# Patient Record
Sex: Male | Born: 1990 | Race: Black or African American | Hispanic: No | Marital: Married | Smoking: Never smoker
Health system: Southern US, Community
[De-identification: ages and names within clinical notes are randomized; demographics above are authoritative.]

## PROBLEM LIST (undated history)

## (undated) DIAGNOSIS — F8189 Other developmental disorders of scholastic skills: Secondary | ICD-10-CM

## (undated) DIAGNOSIS — J45909 Unspecified asthma, uncomplicated: Secondary | ICD-10-CM

## (undated) DIAGNOSIS — J309 Allergic rhinitis, unspecified: Secondary | ICD-10-CM

## (undated) DIAGNOSIS — L2089 Other atopic dermatitis: Secondary | ICD-10-CM

## (undated) HISTORY — DX: Other developmental disorders of scholastic skills: F81.89

## (undated) HISTORY — DX: Allergic rhinitis, unspecified: J30.9

## (undated) HISTORY — DX: Unspecified asthma, uncomplicated: J45.909

## (undated) HISTORY — DX: Other atopic dermatitis: L20.89

---
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
## 2009-03-26 ENCOUNTER — Ambulatory Visit: Payer: Self-pay | Admitting: Internal Medicine

## 2009-03-26 LAB — CONVERTED CEMR LAB
ALT: 41 units/L (ref 0–53)
AST: 34 units/L (ref 0–37)
Albumin: 4.4 g/dL (ref 3.5–5.2)
Alkaline Phosphatase: 100 units/L (ref 39–117)
Amphetamine Screen, Ur: NEGATIVE
BUN: 9 mg/dL (ref 6–23)
Barbiturate Quant, Ur: NEGATIVE
Basophils Absolute: 0.1 10*3/uL (ref 0.0–0.1)
Basophils Relative: 0.7 % (ref 0.0–3.0)
Benzodiazepines.: NEGATIVE
Bilirubin Urine: NEGATIVE
Bilirubin, Direct: 0.1 mg/dL (ref 0.0–0.3)
CO2: 32 meq/L (ref 19–32)
Calcium: 9.7 mg/dL (ref 8.4–10.5)
Chloride: 100 meq/L (ref 96–112)
Cocaine Metabolites: NEGATIVE
Creatinine, Ser: 0.7 mg/dL (ref 0.4–1.5)
Creatinine,U: 103.7 mg/dL
Eosinophils Absolute: 2 10*3/uL — ABNORMAL HIGH (ref 0.0–0.7)
Eosinophils Relative: 26 % — ABNORMAL HIGH (ref 0.0–5.0)
GFR calc non Af Amer: 185.25 mL/min (ref >60)
Glucose, Bld: 91 mg/dL (ref 70–99)
HCT: 45.7 % (ref 39.0–52.0)
Hemoglobin, Urine: NEGATIVE
Hemoglobin: 15.2 g/dL (ref 13.0–17.0)
Ketones, ur: NEGATIVE mg/dL
Leukocytes, UA: NEGATIVE
Lymphocytes Relative: 14.2 % (ref 12.0–46.0)
Lymphs Abs: 1.1 10*3/uL (ref 0.7–4.0)
MCHC: 33.2 g/dL (ref 30.0–36.0)
MCV: 97.5 fL (ref 78.0–100.0)
Marijuana Metabolite: NEGATIVE
Methadone: NEGATIVE
Monocytes Absolute: 0.3 10*3/uL (ref 0.1–1.0)
Monocytes Relative: 3.7 % (ref 3.0–12.0)
Neutro Abs: 4.3 10*3/uL (ref 1.4–7.7)
Neutrophils Relative %: 55.4 % (ref 43.0–77.0)
Nitrite: NEGATIVE
Opiates: NEGATIVE
Phencyclidine (PCP): NEGATIVE
Platelets: 290 10*3/uL (ref 150.0–400.0)
Potassium: 3.5 meq/L (ref 3.5–5.1)
Propoxyphene: NEGATIVE
RBC: 4.69 M/uL (ref 4.22–5.81)
RDW: 13.4 % (ref 11.5–14.6)
Sodium: 140 meq/L (ref 135–145)
Specific Gravity, Urine: 1.015 (ref 1.000–1.030)
TSH: 1 microintl units/mL (ref 0.35–5.50)
Total Bilirubin: 0.9 mg/dL (ref 0.3–1.2)
Total Protein, Urine: 30 mg/dL
Total Protein: 7.5 g/dL (ref 6.0–8.3)
Urine Glucose: NEGATIVE mg/dL
Urobilinogen, UA: 1 (ref 0.0–1.0)
WBC: 7.8 10*3/uL (ref 4.5–10.5)
pH: 6.5 (ref 5.0–8.0)

## 2009-03-27 DIAGNOSIS — J45909 Unspecified asthma, uncomplicated: Secondary | ICD-10-CM | POA: Insufficient documentation

## 2009-03-27 HISTORY — DX: Unspecified asthma, uncomplicated: J45.909

## 2009-03-30 ENCOUNTER — Telehealth: Payer: Self-pay | Admitting: Internal Medicine

## 2009-04-09 ENCOUNTER — Ambulatory Visit: Payer: Self-pay | Admitting: Internal Medicine

## 2009-05-04 ENCOUNTER — Emergency Department (HOSPITAL_COMMUNITY): Admission: EM | Admit: 2009-05-04 | Discharge: 2009-05-04 | Payer: Self-pay | Admitting: Emergency Medicine

## 2009-05-06 ENCOUNTER — Ambulatory Visit: Payer: Self-pay | Admitting: Internal Medicine

## 2009-05-11 ENCOUNTER — Telehealth: Payer: Self-pay | Admitting: Internal Medicine

## 2009-06-26 ENCOUNTER — Telehealth: Payer: Self-pay | Admitting: Internal Medicine

## 2009-06-26 ENCOUNTER — Ambulatory Visit: Payer: Self-pay | Admitting: Internal Medicine

## 2009-06-26 DIAGNOSIS — L2089 Other atopic dermatitis: Secondary | ICD-10-CM | POA: Insufficient documentation

## 2009-06-26 HISTORY — DX: Other atopic dermatitis: L20.89

## 2009-06-26 LAB — CONVERTED CEMR LAB
Basophils Absolute: 0 10*3/uL (ref 0.0–0.1)
Basophils Relative: 0 % (ref 0.0–3.0)
Eosinophils Absolute: 1.6 10*3/uL — ABNORMAL HIGH (ref 0.0–0.7)
Eosinophils Relative: 10.8 % — ABNORMAL HIGH (ref 0.0–5.0)
HCT: 43.3 % (ref 39.0–52.0)
Hemoglobin: 14.1 g/dL (ref 13.0–17.0)
IgE (Immunoglobulin E), Serum: 1548.6 intl units/mL — ABNORMAL HIGH (ref 0.0–180.0)
Lymphocytes Relative: 13.4 % (ref 12.0–46.0)
Lymphs Abs: 1.9 10*3/uL (ref 0.7–4.0)
MCHC: 32.7 g/dL (ref 30.0–36.0)
MCV: 97.7 fL (ref 78.0–100.0)
Monocytes Absolute: 1.4 10*3/uL — ABNORMAL HIGH (ref 0.1–1.0)
Monocytes Relative: 9.5 % (ref 3.0–12.0)
Neutro Abs: 9.5 10*3/uL — ABNORMAL HIGH (ref 1.4–7.7)
Neutrophils Relative %: 66.3 % (ref 43.0–77.0)
Platelets: 369 10*3/uL (ref 150.0–400.0)
RBC: 4.43 M/uL (ref 4.22–5.81)
RDW: 13.3 % (ref 11.5–14.6)
WBC: 14.4 10*3/uL — ABNORMAL HIGH (ref 4.5–10.5)

## 2009-09-30 ENCOUNTER — Ambulatory Visit: Payer: Self-pay | Admitting: Internal Medicine

## 2009-09-30 DIAGNOSIS — J309 Allergic rhinitis, unspecified: Secondary | ICD-10-CM

## 2009-09-30 HISTORY — DX: Allergic rhinitis, unspecified: J30.9

## 2009-10-01 ENCOUNTER — Encounter (INDEPENDENT_AMBULATORY_CARE_PROVIDER_SITE_OTHER): Payer: Self-pay | Admitting: *Deleted

## 2009-10-06 ENCOUNTER — Telehealth: Payer: Self-pay | Admitting: Internal Medicine

## 2009-10-14 ENCOUNTER — Encounter: Payer: Self-pay | Admitting: Internal Medicine

## 2009-10-20 ENCOUNTER — Ambulatory Visit: Payer: Self-pay | Admitting: Internal Medicine

## 2009-10-20 DIAGNOSIS — R5381 Other malaise: Secondary | ICD-10-CM

## 2009-10-20 DIAGNOSIS — R5383 Other fatigue: Secondary | ICD-10-CM

## 2009-10-20 LAB — CONVERTED CEMR LAB
ALT: 21 units/L (ref 0–53)
AST: 21 units/L (ref 0–37)
Albumin: 4.5 g/dL (ref 3.5–5.2)
Alkaline Phosphatase: 101 units/L (ref 39–117)
BUN: 7 mg/dL (ref 6–23)
Basophils Absolute: 0 10*3/uL (ref 0.0–0.1)
Basophils Relative: 0 % (ref 0.0–3.0)
Bilirubin, Direct: 0.1 mg/dL (ref 0.0–0.3)
CO2: 29 meq/L (ref 19–32)
Calcium: 10.3 mg/dL (ref 8.4–10.5)
Chloride: 100 meq/L (ref 96–112)
Creatinine, Ser: 0.7 mg/dL (ref 0.4–1.5)
Eosinophils Absolute: 0 10*3/uL (ref 0.0–0.7)
Eosinophils Relative: 0 % (ref 0.0–5.0)
GFR calc non Af Amer: 200.63 mL/min (ref >60)
Glucose, Bld: 113 mg/dL — ABNORMAL HIGH (ref 70–99)
HCT: 48.8 % (ref 39.0–52.0)
Hemoglobin: 16.4 g/dL (ref 13.0–17.0)
Lymphocytes Relative: 9.8 % — ABNORMAL LOW (ref 12.0–46.0)
Lymphs Abs: 1.4 10*3/uL (ref 0.7–4.0)
MCHC: 33.7 g/dL (ref 30.0–36.0)
MCV: 96.4 fL (ref 78.0–100.0)
Monocytes Absolute: 0.8 10*3/uL (ref 0.1–1.0)
Monocytes Relative: 6 % (ref 3.0–12.0)
Neutro Abs: 11.7 10*3/uL — ABNORMAL HIGH (ref 1.4–7.7)
Neutrophils Relative %: 84.2 % — ABNORMAL HIGH (ref 43.0–77.0)
Platelets: 333 10*3/uL (ref 150.0–400.0)
Potassium: 3.5 meq/L (ref 3.5–5.1)
RBC: 5.06 M/uL (ref 4.22–5.81)
RDW: 14.2 % (ref 11.5–14.6)
Sed Rate: 9 mm/hr (ref 0–22)
Sodium: 143 meq/L (ref 135–145)
TSH: 0.62 microintl units/mL (ref 0.35–5.50)
Total Bilirubin: 0.4 mg/dL (ref 0.3–1.2)
Total Protein: 8.1 g/dL (ref 6.0–8.3)
WBC: 13.9 10*3/uL — ABNORMAL HIGH (ref 4.5–10.5)

## 2009-10-21 ENCOUNTER — Encounter (INDEPENDENT_AMBULATORY_CARE_PROVIDER_SITE_OTHER): Payer: Self-pay | Admitting: *Deleted

## 2009-11-03 ENCOUNTER — Ambulatory Visit: Payer: Self-pay | Admitting: Internal Medicine

## 2009-11-23 ENCOUNTER — Telehealth: Payer: Self-pay | Admitting: Internal Medicine

## 2009-11-23 ENCOUNTER — Ambulatory Visit: Payer: Self-pay | Admitting: Internal Medicine

## 2009-12-23 ENCOUNTER — Telehealth: Payer: Self-pay | Admitting: Internal Medicine

## 2009-12-30 ENCOUNTER — Encounter: Payer: Self-pay | Admitting: Internal Medicine

## 2010-03-01 ENCOUNTER — Ambulatory Visit: Payer: Self-pay | Admitting: Internal Medicine

## 2010-03-01 ENCOUNTER — Telehealth: Payer: Self-pay | Admitting: Internal Medicine

## 2010-03-01 DIAGNOSIS — J019 Acute sinusitis, unspecified: Secondary | ICD-10-CM

## 2010-03-15 ENCOUNTER — Ambulatory Visit: Payer: Self-pay | Admitting: Internal Medicine

## 2010-05-11 ENCOUNTER — Ambulatory Visit: Payer: Self-pay | Admitting: Internal Medicine

## 2010-05-11 ENCOUNTER — Telehealth: Payer: Self-pay | Admitting: Internal Medicine

## 2010-05-11 DIAGNOSIS — F8189 Other developmental disorders of scholastic skills: Secondary | ICD-10-CM

## 2010-05-11 DIAGNOSIS — T783XXA Angioneurotic edema, initial encounter: Secondary | ICD-10-CM | POA: Insufficient documentation

## 2010-05-11 DIAGNOSIS — L509 Urticaria, unspecified: Secondary | ICD-10-CM

## 2010-05-11 HISTORY — DX: Other developmental disorders of scholastic skills: F81.89

## 2010-05-14 ENCOUNTER — Telehealth: Payer: Self-pay | Admitting: Internal Medicine

## 2010-05-14 ENCOUNTER — Encounter: Payer: Self-pay | Admitting: Internal Medicine

## 2010-06-18 ENCOUNTER — Ambulatory Visit (HOSPITAL_BASED_OUTPATIENT_CLINIC_OR_DEPARTMENT_OTHER)
Admission: RE | Admit: 2010-06-18 | Discharge: 2010-06-18 | Payer: Self-pay | Source: Home / Self Care | Attending: Internal Medicine | Admitting: Internal Medicine

## 2010-06-21 ENCOUNTER — Encounter: Payer: Self-pay | Admitting: Internal Medicine

## 2010-07-04 ENCOUNTER — Encounter: Payer: Self-pay | Admitting: Internal Medicine

## 2010-07-13 NOTE — Progress Notes (Signed)
Summary: ALLERGY TESTING  Phone Note Call from Patient   Caller: kathleen- [facility] Call For: katie Summary of Call: pt is scheduled (per your request) for ALT ON 7 / 12 AT 3PM.  Initial call taken by: Tivis Ringer, CNA,  November 23, 2009 5:32 PM  Follow-up for Phone Call        Noted and placed on calender.[name] CMA  November 24, 2009 8:21 AM

## 2010-07-13 NOTE — Progress Notes (Signed)
Summary: PA Topicort  Phone Note From Pharmacy   Caller: [facility]  [address] [phone]* Summary of Call: PA required for Topicort 0.25% cream. PA approved from 05/14/2010 - 05-14-2011. Pharmacy informed Initial call taken by: Margaret Pyle, CMA,  May 14, 2010 3:20 PM

## 2010-07-13 NOTE — Letter (Signed)
Summary: [facility] Consult Scheduled Letter  [facility]  [address] [place], [place] [address]   Phone: [phone]  Fax: [phone]      10/21/2009 MRN: [id number]  [facility] [address] [place], [place]  [address]    Dear Roger Henderson,      We have scheduled an appointment for you.  At the recommendation of Dr.Jones, we have scheduled you a consult with Dr.Young [facility] on June 13,2011 at 3:45pm.Their phone number is [phone].  If this appointment day and time is not convenient for you, please feel free to call the office of the doctor you are being referred to at the number listed above and reschedule the appointment.  [facility] - 2Nd Floor  [address]  [place] [address]  Thank you,  Patient Care Coordinator [facility]

## 2010-07-13 NOTE — Progress Notes (Signed)
Summary: Call Report  Phone Note Other Incoming   Caller: [facility] Summary of Call: [facility] Triage Call Report Triage Record Num: [id number] Operator: Chevis Pretty Patient Name: Roger Henderson Call Date & Time: 05/07/2010 1:12:40PM Patient Phone: [phone] PCP: Patient Gender: Male PCP Fax : Patient DOB: 1991/05/02 Practice Name: [facility] Reason for Call: Dad/William calling about cough and congestion. States cold symptoms and congestion x 2 weeks. Patient has asthma but currently is not wheezing. c/o headache with congestion. Denies earache, wheezing, resp distress, or fever. Coughing up yellow/green mucus. Per protocol, appt advised within 24 hours; no answer at back door line as published. Patient is near the office; states will check to see if office still open; if not, will take to UC. Protocol(s) Used: Cough (Pediatric) Recommended Outcome per Protocol: See Provider within 24 hours Reason for Outcome: [1] Age > 5 years AND [2] sinus pain (not just congestion) is also present Care Advice:  ~ CARE ADVICE given per Cough (Pediatric) guideline.  ~ AVOID TOBACCO SMOKE: Active or passive smoking makes coughs much worse. CALL BACK IF: - Your child becomes worse  ~ COUGHING SPASMS: - Expose to warm mist (e.g., foggy bathroom). - Give warm fluids to drink (e.g., warm water or apple juice). - Amount: If 3 months to 22 year of age, give warm fluids in a dosage of 1-3 teaspoons (5-15 ml) four times per day when coughing. If over 1 year of age, use unlimited amounts as needed. - Reason: both relax the airway and loosen up the phlegm  ~ DECONGESTANT NOSE SPRAY (No prescription needed): - Use this only if the sinus still seems blocked up after nasal washes AND age 23 years or older. Use the long-acting type (e.g., Afrin). - Dose: 1 spray on each side 2 times/day. - Always clean out the nose before using. - Use routinely for 2 days, thereafter only for symptoms.  Don't use for more than 5 days. (Reason: rebound congestion.)  ~ FLUIDS: Encourage your child to drink adequate fluids to prevent dehydration.  Initial call taken by: Margaret Pyle, CMA,  May 11, 2010 8:45 AM

## 2010-07-13 NOTE — Assessment & Plan Note (Signed)
Summary: congestion/#/cd   Vital Signs:  Patient profile:   20 year old male Height:      71 inches Weight:      136.25 pounds BMI:     19.07 O2 Sat:      98 % on Room air Temp:     97.9 degrees F oral Pulse rate:   69 / minute BP sitting:   100 / 62  (left arm) Cuff size:   regular  Vitals Entered By: Zella Ball Ewing CMA Duncan Dull) (May 11, 2010 2:58 PM)  O2 Flow:  Room air CC: Chest congestion and cough/RE   Primary Care Provider:  Etta Grandchild MD  CC:  Chest congestion and cough/RE.  History of Present Illness: Here with family friend;  c/o 2 wks gradually worsening rash, usually to the torso and extremities that seems to come and go with itching (no pain or fever, or trauma) and mostly gone today, but here as his face and upper chest now with similar rash but also with marked swelling diffusely with itching as well; Pt denies CP, worsening sob, doe, wheezing, orthopnea, pnd, worsening LE edema, palps, dizziness or syncope  Pt denies new neuro symptoms such as headache, facial or extremity weakness  Has some nasal congestion with clearish drainage and cough,  as well as requestin refill on chronic meds for which he is out;  again no fever, pain, chills, n/v, wheezing, lip or tongue swelling.  No other new or ongoing prescriptoin or OTC med use.  Uses Dove soap at home, not sure about which detergents or any change recently.    Problems Prior to Update: 1)  Learning Disability  (ICD-315.2) 2)  Angioedema  (ICD-995.1) 3)  Urticaria  (ICD-708.9) 4)  Sinusitis- Acute-nos  (ICD-461.9) 5)  Allergic Rhinitis  (ICD-477.9) 6)  ? of Obstructive Sleep Apnea  (ICD-327.23) 7)  Fatigue  (ICD-780.79) 8)  Allergic Rhinitis Cause Unspecified  (ICD-477.9) 9)  Eczema, Atopic  (ICD-691.8) 10)  Asthma  (ICD-493.90)  Medications Prior to Update: 1)  Topicort 0.25 % Crea (Desoximetasone) .... Apply To Rash Two Times A Day, Ok To Fill Today 2)  Loratadine 10 Mg Tabs (Loratadine) .Marland Kitchen.. 1 By Mouth  Qd  Current Medications (verified): 1)  Topicort 0.25 % Crea (Desoximetasone) .... Apply To Rash Two Times A Day, Ok To Fill Today 2)  Loratadine 10 Mg Tabs (Loratadine) .Marland Kitchen.. 1 By Mouth Once Daily 3)  Prednisone 10 Mg Tabs (Prednisone) .... 4po Qd For 3days, Then 3po Qd For 3days, Then 2po Qd For 3days, Then 1po Qd For 3 Days, Then Stop 4)  Hydroxyzine Hcl 25 Mg Tabs (Hydroxyzine Hcl) .Marland Kitchen.. 1po Q 6 Hrs As Needed Itch  Allergies (verified): No Known Drug Allergies  Past History:  Past Surgical History: Last updated: 03/26/2009 Denies surgical history  Social History: Last updated: 03/26/2009 11 th grade Never Smoked Alcohol use-no Drug use-no Regular exercise-yes  Risk Factors: Alcohol Use: 0 (03/15/2010) Exercise: yes (03/26/2009)  Risk Factors: Smoking Status: never (03/15/2010) Passive Smoke Exposure: yes (03/15/2010)  Past Medical History: eczema Asthma Allergic Rhinitis learning disabled  Review of Systems       all otherwise negative per pt -    Physical Exam  General:  alert, well-developed, well-nourished, well-hydrated, and underweight appearing.   Head:  scaling and excoriation over the scalp diffusely. no exudates or fissuring. no induration or streaking. Eyes:  vision grossly intact, pupils equal, pupils round, and pupils reactive to light.   Ears:  R  ear normal and L ear normal.   Nose:  nasal dischargemucosal pallor and mucosal edema.  nasal dischargemucosal pallor and mucosal edema.   Mouth:  no gingival abnormalities and pharynx pink and moist.  , no tongue swellingno gingival abnormalities and pharynx pink and moist.   Neck:  supple and no masses.  supple and no masses.   Lungs:  normal respiratory effort and normal breath sounds.  normal respiratory effort and normal breath sounds.   Heart:  normal rate and regular rhythm.  normal rate and regular rhythm.   Extremities:  no edema, no erythema  Skin:  has rather remarkable moderate diffuse facial  nontender sweling and erythema to the facies and forehead and neck, no abscess or sinus tender; also similar rash to mid upper chest sternal area without sweling   Impression & Recommendations:  Problem # 1:  URTICARIA (ICD-708.9)  unclear etiology, for depomedrol IM todya, predpack for home, atarax for itch  Orders: Depo- Medrol 40mg  (J1030) Depo- Medrol 80mg  (J1040) Admin of Therapeutic Inj  intramuscular or subcutaneous ([address])  Problem # 2:  ANGIOEDEMA (ICD-995.1) facial only; cont dove soap;  consider change detergent or other allergens, consider allergy referral  Problem # 3:  ASTHMA (ICD-493.90)  His updated medication list for this problem includes:    Prednisone 10 Mg Tabs (Prednisone) .Marland KitchenMarland KitchenMarland KitchenMarland Kitchen 4po qd for 3days, then 3po qd for 3days, then 2po qd for 3days, then 1po qd for 3 days, then stop stable overall by hx and exam, ok to continue meds/tx as is   Problem # 4:  ECZEMA, ATOPIC (ICD-691.8)  His updated medication list for this problem includes:    Topicort 0.25 % Crea (Desoximetasone) .Marland Kitchen... Apply to rash two times a day, ok to fill today med refilled today, stable overall by hx and exam, ok to continue meds/tx as is   Complete Medication List: 1)  Topicort 0.25 % Crea (Desoximetasone) .... Apply to rash two times a day, ok to fill today 2)  Loratadine 10 Mg Tabs (Loratadine) .Marland Kitchen.. 1 by mouth once daily 3)  Prednisone 10 Mg Tabs (Prednisone) .... 4po qd for 3days, then 3po qd for 3days, then 2po qd for 3days, then 1po qd for 3 days, then stop 4)  Hydroxyzine Hcl 25 Mg Tabs (Hydroxyzine hcl) .Marland Kitchen.. 1po q 6 hrs as needed itch  Patient Instructions: 1)  You had the steroid shot today 2)  Please take all new medications as prescribed  3)  Continue all previous medications as before this visit  - all medications were sent to the pharmacy 4)  You can also use Allegra OTC or it's generic for congestion and rash 5)  Please call for allergy referral if problem recurs 6)  Please  schedule an appointment with your primary doctor as needed Prescriptions: HYDROXYZINE HCL 25 MG TABS (HYDROXYZINE HCL) 1po q 6 hrs as needed itch  #40 x 1   Entered and Authorized by:   Corwin Levins MD   Signed by:   Corwin Levins MD on 05/11/2010   Method used:   Electronically to        [facility] [address] [phone]* (retail)       [address]       [place], [place]  [address]       Ph: [phone]       Fax: [phone]   RxID:   [id number] PREDNISONE 10 MG TABS (PREDNISONE) 4po qd for 3days, then 3po qd for 3days, then  2po qd for 3days, then 1po qd for 3 days, then stop  #30 x 0   Entered and Authorized by:   Corwin Levins MD   Signed by:   Corwin Levins MD on 05/11/2010   Method used:   Electronically to        [facility] [address] [phone]* (retail)       [address]       [place], [place]  [address]       Ph: [phone]       Fax: [phone]   RxID:   [id number] TOPICORT 0.25 % CREA (DESOXIMETASONE) Apply to rash two times a day, ok to fill today  #200 x 1   Entered and Authorized by:   Corwin Levins MD   Signed by:   Corwin Levins MD on 05/11/2010   Method used:   Electronically to        [facility] [address] [phone]* (retail)       [address]       [place], [place]  [address]       Ph: [phone]       Fax: [phone]   RxID:   [id number] LORATADINE 10 MG TABS (LORATADINE) 1 by mouth once daily  #30 x 11   Entered and Authorized by:   Corwin Levins MD   Signed by:   Corwin Levins MD on 05/11/2010   Method used:   Electronically to        [facility] [address] [phone]* (retail)       [address]       [place], [place]  [address]       Ph: [phone]       Fax: [phone]   RxID:   [id number]    Medication Administration  Injection # 1:    Medication: Depo- Medrol 40mg     Diagnosis: URTICARIA (ICD-708.9)    Route: IM    Site: LUOQ gluteus    Exp Date: 09/2012    Lot #: [id number]    Mfr: Pharmacia    Comments: patient received 120mg   Depo-Medrol    Patient tolerated injection without complications    Given by: Zella Ball Ewing CMA Duncan Dull) (May 11, 2010 4:36 PM)  Injection # 2:    Medication: Depo- Medrol 80mg     Diagnosis: URTICARIA (ICD-708.9)    Route: IM    Site: LUOQ gluteus    Exp Date: 09/2012    Lot #: [id number]    Mfr: Pharmacia    Given by: Zella Ball Ewing CMA Duncan Dull) (May 11, 2010 4:36 PM)  Orders Added: 1)  Est. Patient Level IV [[address]] 2)  Depo- Medrol 40mg  [J1030] 3)  Depo- Medrol 80mg  [J1040] 4)  Admin of Therapeutic Inj  intramuscular or subcutaneous [[address]]

## 2010-07-13 NOTE — Medication Information (Signed)
Summary: Prior [facility] for Desoximetasone/[facility]  Prior [facility] for Desoximetasone/[facility]   Imported By: Sherian Rein 05/19/2010 12:26:54  _____________________________________________________________________  External Attachment:    Type:   Image     Comment:   External Document

## 2010-07-13 NOTE — Progress Notes (Signed)
Summary: nos appt  Phone Note Call from Patient   Caller: [email or web address]  Call For: Jeimy Bickert Summary of Call: ATC pt to rsc nos from 7/12, phone disconnected. Initial call taken by: Darletta Moll,  December 23, 2009 3:52 PM

## 2010-07-13 NOTE — Consult Note (Signed)
Summary: [facility]  [facility]   Imported By: Sherian Rein 10/16/2009 09:11:11  _____________________________________________________________________  External Attachment:    Type:   Image     Comment:   External Document

## 2010-07-13 NOTE — Assessment & Plan Note (Signed)
Summary: 2 WK ROV /NWS   Vital Signs:  Patient profile:   20 year old male Height:      71 inches Weight:      130 pounds BMI:     18.20 O2 Sat:      99 % on Room air Temp:     97.1 degrees F oral Pulse rate:   60 / minute Pulse rhythm:   regular Resp:     16 per minute BP sitting:   104 / 68  (left arm) Cuff size:   regular  Vitals Entered By: Rock Nephew CMA (March 15, 2010 3:36 PM)  O2 Flow:  Room air  Primary Care Provider:  Etta Grandchild MD   History of Present Illness: He feels well on return for f/up.  Preventive Screening-Counseling & Management  Alcohol-Tobacco     Alcohol drinks/day: 0     Smoking Status: never     Passive Smoke Exposure: yes     Passive Smoke Counseling: to avoid passive smoke exposure  Clinical Review Panels:  Immunizations   Last Tetanus Booster:  Tdap (03/01/2010)  Diabetes Management   Creatinine:  0.7 (10/20/2009)  CBC   WBC:  13.9 (10/20/2009)   RBC:  5.06 (10/20/2009)   Hgb:  16.4 (10/20/2009)   Hct:  48.8 (10/20/2009)   Platelets:  333.0 (10/20/2009)   MCV  96.4 (10/20/2009)   MCHC  33.7 (10/20/2009)   RDW  14.2 (10/20/2009)   PMN:  84.2 (10/20/2009)   Lymphs:  9.8 (10/20/2009)   Monos:  6.0 (10/20/2009)   Eosinophils:  0.0 (10/20/2009)   Basophil:  0.0 (10/20/2009)  Complete Metabolic Panel   Glucose:  113 (10/20/2009)   Sodium:  143 (10/20/2009)   Potassium:  3.5 (10/20/2009)   Chloride:  100 (10/20/2009)   CO2:  29 (10/20/2009)   BUN:  7 (10/20/2009)   Creatinine:  0.7 (10/20/2009)   Albumin:  4.5 (10/20/2009)   Total Protein:  8.1 (10/20/2009)   Calcium:  10.3 (10/20/2009)   Total Bili:  0.4 (10/20/2009)   Alk Phos:  101 (10/20/2009)   SGPT (ALT):  21 (10/20/2009)   SGOT (AST):  21 (10/20/2009)   Problems Prior to Update: 1)  Sinusitis- Acute-nos  (ICD-461.9) 2)  Allergic Rhinitis  (ICD-477.9) 3)  ? of Obstructive Sleep Apnea  (ICD-327.23) 4)  Fatigue  (ICD-780.79) 5)  Allergic Rhinitis  Cause Unspecified  (ICD-477.9) 6)  Eczema, Atopic  (ICD-691.8) 7)  Asthma  (ICD-493.90)  Medications Prior to Update: 1)  Topicort 0.25 % Crea (Desoximetasone) .... Apply To Rash Two Times A Day, Ok To Fill Today 2)  Loratadine 10 Mg Tabs (Loratadine) .Marland Kitchen.. 1 By Mouth Qd  Current Medications (verified): 1)  Topicort 0.25 % Crea (Desoximetasone) .... Apply To Rash Two Times A Day, Ok To Fill Today 2)  Loratadine 10 Mg Tabs (Loratadine) .Marland Kitchen.. 1 By Mouth Qd  Allergies (verified): No Known Drug Allergies  Past History:  Past Medical History: Last updated: 11/23/2009 eczema Asthma Allergic Rhinitis  Past Surgical History: Last updated: 03/26/2009 Denies surgical history  Family History: Last updated: 11/23/2009 Grandmother-emphysema Father-hx of blood clot about 1 year ago(truck driver). Father- eczema  Social History: Last updated: 03/26/2009 11 th grade Never Smoked Alcohol use-no Drug use-no Regular exercise-yes  Risk Factors: Alcohol Use: 0 (03/15/2010) Exercise: yes (03/26/2009)  Risk Factors: Smoking Status: never (03/15/2010) Passive Smoke Exposure: yes (03/15/2010)  Family History: Reviewed history from 11/23/2009 and no changes required. Grandmother-emphysema Father-hx of blood  clot about 1 year ago(truck driver). Father- eczema  Social History: Reviewed history from 03/26/2009 and no changes required. 11 th grade Never Smoked Alcohol use-no Drug use-no Regular exercise-yes  Review of Systems  The patient denies anorexia, fever, hoarseness, chest pain, peripheral edema, prolonged cough, headaches, hemoptysis, abdominal pain, hematuria, suspicious skin lesions, enlarged lymph nodes, and angioedema.   ENT:  Complains of nasal congestion; denies decreased hearing, difficulty swallowing, ear discharge, earache, hoarseness, nosebleeds, postnasal drainage, ringing in ears, sinus pressure, and sore throat. Derm:  Complains of itching and rash; denies  changes in nail beds, dryness, flushing, hair loss, insect bite(s), lesion(s), and poor wound healing.  Physical Exam  General:  alert, well-developed, well-nourished, well-hydrated, and underweight appearing.   Head:  scaling and excoriation over the scalp diffusely. no exudates or fissuring. no induration or streaking. Nose:  no external deformity, no airflow obstruction, no intranasal foreign body, no nasal polyps, no nasal mucosal lesions, no mucosal friability, no active bleeding or clots, no sinus percussion tenderness, no septum abnormalities, no nasal discharge, mucosal pallor, and mucosal edema.   Mouth:  Oral mucosa and oropharynx without lesions or exudates.  Teeth in good repair. Neck:  supple, full ROM, no masses, no thyromegaly, no thyroid nodules or tenderness, no JVD, normal carotid upstroke, no cervical lymphadenopathy, and no neck tenderness.   Lungs:  normal respiratory effort, no intercostal retractions, no accessory muscle use, normal breath sounds, no dullness, no fremitus, no crackles, and no wheezes.   Heart:  normal rate, regular rhythm, no murmur, no gallop, no rub, and no JVD.   Abdomen:  Bowel sounds positive,abdomen soft and non-tender without masses, organomegaly or hernias noted. Msk:  normal ROM, no joint tenderness, no joint swelling, no joint warmth, no redness over joints, no joint deformities, no joint instability, and no crepitation.   Pulses:  R and L carotid,radial,femoral,dorsalis pedis and posterior tibial pulses are full and equal bilaterally Extremities:  mild  clubbing, but no  cyanosis, edema, or deformity noted with normal full range of motion of all joints.   Neurologic:  alert & oriented X3, cranial nerves II-XII intact, strength normal in all extremities, sensation intact to light touch, sensation intact to pinprick, gait normal, and DTRs symmetrical and normal.   Skin:  diffuse ezcema with erythema- 50% less  than before, scaling, lichenification, no  suspicious lesions, no ecchymoses, no petechiae, no purpura, no ulcerations, and no edema.  there is no oozing, erythema, or wounds Cervical Nodes:  no anterior cervical adenopathy and no posterior cervical adenopathy.   Psych:  Cognition and judgment appear intact. Alert and cooperative with normal attention span and concentration. No apparent delusions, illusions, hallucinations   Impression & Recommendations:  Problem # 1:  SINUSITIS- ACUTE-NOS (ICD-461.9) Assessment Improved  Problem # 2:  ECZEMA, ATOPIC (ICD-691.8) Assessment: Improved  His updated medication list for this problem includes:    Topicort 0.25 % Crea (Desoximetasone) .Marland Kitchen... Apply to rash two times a day, ok to fill today  Problem # 3:  ALLERGIC RHINITIS (ICD-477.9) Assessment: Unchanged  His updated medication list for this problem includes:    Loratadine 10 Mg Tabs (Loratadine) .Marland Kitchen... 1 by mouth qd  Complete Medication List: 1)  Topicort 0.25 % Crea (Desoximetasone) .... Apply to rash two times a day, ok to fill today 2)  Loratadine 10 Mg Tabs (Loratadine) .Marland Kitchen.. 1 by mouth qd  Patient Instructions: 1)  Please schedule a follow-up appointment in 3 months. Prescriptions: LORATADINE 10 MG TABS (LORATADINE)  1 by mouth qd  #30 x 11   Entered and Authorized by:   Etta Grandchild MD   Signed by:   Etta Grandchild MD on 03/15/2010   Method used:   Electronically to        [facility] [address] [phone]* (retail)       [address]       [place], [place]  [address]       Ph: [phone]       Fax: [phone]   RxID:   [id number]

## 2010-07-13 NOTE — Assessment & Plan Note (Signed)
Summary: FU---ECZEMA---STC   Vital Signs:  Patient profile:   20 year old male Height:      71 inches Weight:      127.8 pounds O2 Sat:      99 % on Room air Temp:     97.4 degrees F oral Pulse rate:   80 / minute Pulse rhythm:   regular Resp:     16 per minute BP sitting:   140 / 90  (left arm) Cuff size:   regular  Vitals Entered By: Rock Nephew CMA (June 26, 2009 2:38 PM)  O2 Flow:  Room air  Primary Care Provider:  Etta Grandchild MD   History of Present Illness: He returns today with his Mother and is doing better but still has some episodes of itching and scaling. His Mother states he has to have a referral from a PCP to see a Dermatologist.  Asthma History    Initial Asthma Severity Rating:    Age range: 12+ years    Symptoms: 0-2 days/week    Nighttime Awakenings: 0-2/month    Interferes w/ normal activity: no limitations    SABA use (not for EIB): 0-2 days/week    Asthma Severity Assessment: Intermittent   Preventive Screening-Counseling & Management  Alcohol-Tobacco     Alcohol drinks/day: 0     Smoking Status: never     Passive Smoke Exposure: yes     Passive Smoke Counseling: to avoid passive smoke exposure  Hep-HIV-STD-Contraception     Hepatitis Risk: no risk noted     HIV Risk: no risk noted     STD Risk: no risk noted  Current Medications (verified): 1)  Xyzal 5 Mg Tabs (Levocetirizine Dihydrochloride) .... Once Daily For Rash and Itching 2)  Topicort 0.25 % Crea (Desoximetasone) .... Apply To Rash Two Times A Day, Ok To Fill Today  Allergies (verified): No Known Drug Allergies  Past History:  Past Medical History: Reviewed history from 03/26/2009 and no changes required. eczema Asthma  Past Surgical History: Reviewed history from 03/26/2009 and no changes required. Denies surgical history  Social History: Reviewed history from 03/26/2009 and no changes required. 11 th grade Never Smoked Alcohol use-no Drug use-no Regular  exercise-yes  Review of Systems  The patient denies anorexia, fever, abdominal pain, and enlarged lymph nodes.   General:  Denies chills, fatigue, fever, malaise, sweats, and weakness. Derm:  Complains of dryness, itching, and rash; denies changes in color of skin and flushing.  Physical Exam  General:  alert, well-developed, well-nourished, well-hydrated, appropriate dress, normal appearance, healthy-appearing, and cooperative to examination.   Neck:  supple, full ROM, no masses, no cervical lymphadenopathy, and no neck tenderness.   Lungs:  Normal respiratory effort, chest expands symmetrically. Lungs are clear to auscultation, no crackles or wheezes. Heart:  Normal rate and regular rhythm. S1 and S2 normal without gallop, murmur, click, rub or other extra sounds. Abdomen:  soft, non-tender, normal bowel sounds, no distention, no masses, no guarding, no rigidity, no rebound tenderness, no hepatomegaly, and no splenomegaly.   Skin:  diffuse ezcema with erythema- 50% less than before, scaling, lichenification, no suspicious lesions, no ecchymoses, no petechiae, no purpura, no ulcerations, and no edema.  there is no oozing, erythema, or wounds Cervical Nodes:  no anterior cervical adenopathy and no posterior cervical adenopathy.   Axillary Nodes:  no R axillary adenopathy and no L axillary adenopathy.   Inguinal Nodes:  no R inguinal adenopathy and no L inguinal adenopathy.  Psych:  Oriented X3, memory intact for recent and remote, normally interactive, good eye contact, not anxious appearing, not depressed appearing, and not agitated.     Impression & Recommendations:  Problem # 1:  ECZEMA, ATOPIC (ICD-691.8) Assessment Improved  His updated medication list for this problem includes:    Topicort 0.25 % Crea (Desoximetasone) .Marland Kitchen... Apply to rash two times a day, ok to fill today  Orders: Venipuncture (21308) T-Allergy Profile [place], [place], [place], [place], [place] [phone]) T-Food Allergy Profile  Specific IgE (86003/[address]) Dermatology Referral (Derma) TLB-CBC Platelet - w/Differential (85025-CBCD)  Problem # 2:  ASTHMA (ICD-493.90) Assessment: Improved  Orders: Venipuncture (46962) T-Allergy Profile [place], [place], [place], [place], [place] [phone]) T-Food Allergy Profile Specific IgE (86003/[id number]) TLB-CBC Platelet - w/Differential (85025-CBCD)  Complete Medication List: 1)  Xyzal 5 Mg Tabs (Levocetirizine dihydrochloride) .... Once daily for rash and itching 2)  Topicort 0.25 % Crea (Desoximetasone) .... Apply to rash two times a day, ok to fill today  Patient Instructions: 1)  Please schedule a follow-up appointment in 2 weeks. Prescriptions: XYZAL 5 MG TABS (LEVOCETIRIZINE DIHYDROCHLORIDE) once daily for rash and itching  #30 x 6   Entered by:   Rock Nephew CMA   Authorized by:   Etta Grandchild MD   Signed by:   Rock Nephew CMA on 06/26/2009   Method used:   Electronically to        [facility] [phone]* (retail)       [address]       [place], [place]  [address]       Ph: [phone]       Fax: [phone]   RxID:   [id number] TOPICORT 0.25 % CREA (DESOXIMETASONE) Apply to rash two times a day, ok to fill today  #200 x 6   Entered by:   Rock Nephew CMA   Authorized by:   Etta Grandchild MD   Signed by:   Rock Nephew CMA on 06/26/2009   Method used:   Electronically to        [facility] [address] [phone]* (retail)       [address]       [place], [place]  [address]       Ph: [phone]       Fax: [phone]   RxID:   [id number]

## 2010-07-13 NOTE — Letter (Signed)
Summary: [facility] Consult Scheduled Letter  [facility]  [address] [place], [place] [address]   Phone: [phone]  Fax: [phone]      10/01/2009 MRN: [id number]  [facility] [address] [place], [place]  [address]    Dear Roger Henderson,      We have scheduled an appointment for you.  At the recommendation of Dr.Jones, we have scheduled you a consult with Dr Pollyann Kennedy on 10/12/09 at 3:00pm.  Their phone number is [phone].  If this appointment day and time is not convenient for you, please feel free to call the office of the doctor you are being referred to at the number listed above and reschedule the appointment.    [facility] [address], [address] [place], [place] [address]    Thank you,  Patient Care Coordinator [facility]

## 2010-07-13 NOTE — Progress Notes (Signed)
Summary: Fexofenadine pa  Phone Note From Pharmacy   Summary of Call: PA request--Fexofenadine. Per medicaid patient must try and fail both Loratadine and Cetrizine. I see that the pt did try generic Zyrtec.Please advise. Initial call taken by: Lucious Groves,  October 06, 2009 1:32 PM  Follow-up for Phone Call        he needs to try claritin then Follow-up by: Etta Grandchild MD,  October 07, 2009 7:36 AM  Additional Follow-up for Phone Call Additional follow up Details #1::        Attempted to call patient to notify, # is not in service. I also tried to contact Esaw Grandchild (father on party release) and that number is also not in service.   *Closed phone note. Additional Follow-up by: Lucious Groves,  October 07, 2009 9:05 AM    New/Updated Medications: LORATADINE 10 MG TABS (LORATADINE) 1 by mouth qd Prescriptions: LORATADINE 10 MG TABS (LORATADINE) 1 by mouth qd  #30 x 3   Entered by:   Lucious Groves   Authorized by:   Etta Grandchild MD   Signed by:   Lucious Groves on 10/07/2009   Method used:   Electronically to        [facility] [address] [phone]* (retail)       [address]       [place], [place]  [address]       Ph: [phone]       Fax: [phone]   RxID:   [id number]

## 2010-07-13 NOTE — Progress Notes (Signed)
     New Problems: B12 DEFICIENCY (ICD-266.2)   New Problems: B12 DEFICIENCY (ICD-266.2) 

## 2010-07-13 NOTE — Assessment & Plan Note (Signed)
Summary: fatigue/changes in behavior such as cold when its hot,etc.-rx...   Vital Signs:  Patient profile:   20 year old male Height:      71 inches Weight:      129 pounds O2 Sat:      99 % Temp:     97.4 degrees F oral Pulse rate:   90 / minute Pulse rhythm:   regular Resp:     16 per minute BP sitting:   122 / 82  (right arm)  Primary Care Provider:  Etta Grandchild MD   History of Present Illness: He returns with complaints that his skin disease has flared with itching, flaking, feeling cold, and he has fatigue. The antihistamines make him sleepy. The Rx for Eucerin given by ? Dermatologist has run out so he has not been applying that lately.  Preventive Screening-Counseling & Management  Alcohol-Tobacco     Alcohol drinks/day: 0     Smoking Status: never     Passive Smoke Exposure: yes     Passive Smoke Counseling: to avoid passive smoke exposure  Hep-HIV-STD-Contraception     Hepatitis Risk: no risk noted     HIV Risk: no risk noted     STD Risk: no risk noted      Drug Use:  no.        Blood Transfusions:  no.    Clinical Review Panels:  CBC   WBC:  14.4 (06/26/2009)   RBC:  4.43 (06/26/2009)   Hgb:  14.1 (06/26/2009)   Hct:  43.3 (06/26/2009)   Platelets:  369.0 (06/26/2009)   MCV  97.7 (06/26/2009)   MCHC  32.7 (06/26/2009)   RDW  13.3 (06/26/2009)   PMN:  66.3 (06/26/2009)   Lymphs:  13.4 (06/26/2009)   Monos:  9.5 (06/26/2009)   Eosinophils:  10.8 (06/26/2009)   Basophil:  0.0 (06/26/2009)   Medications Prior to Update: 1)  Topicort 0.25 % Crea (Desoximetasone) .... Apply To Rash Two Times A Day, Ok To Fill Today 2)  Loratadine 10 Mg Tabs (Loratadine) .Marland Kitchen.. 1 By Mouth Qd  Current Medications (verified): 1)  Topicort 0.25 % Crea (Desoximetasone) .... Apply To Rash Two Times A Day, Ok To Fill Today 2)  Loratadine 10 Mg Tabs (Loratadine) .Marland Kitchen.. 1 By Mouth Qd  Allergies: No Known Drug Allergies  Past History:  Past Medical History: Reviewed  history from 03/26/2009 and no changes required. eczema Asthma  Past Surgical History: Reviewed history from 03/26/2009 and no changes required. Denies surgical history  Family History: Reviewed history and no changes required.  Social History: Reviewed history from 03/26/2009 and no changes required. 11 th grade Never Smoked Alcohol use-no Drug use-no Regular exercise-yes  Review of Systems  The patient denies anorexia, fever, weight loss, weight gain, chest pain, peripheral edema, prolonged cough, headaches, hemoptysis, abdominal pain, difficulty walking, depression, enlarged lymph nodes, and angioedema.   Derm:  Complains of dryness, itching, and rash; denies changes in color of skin, changes in nail beds, excessive perspiration, flushing, hair loss, insect bite(s), lesion(s), and poor wound healing.  Physical Exam  General:  alert, well-developed, well-nourished, well-hydrated, and underweight appearing.   Head:  scaling and excoriation over the scalp diffusely. no exudates or fissuring. no induration or streaking. Eyes:  vision grossly intact, pupils equal, pupils round, and pupils reactive to light.   Mouth:  good dentition, pharynx pink and moist, no erythema, no exudates, no pharyngeal crowing, no lesions, no erosions, no tongue abnormalities, and no leukoplakia.  Neck:  supple, full ROM, no masses, no thyromegaly, no thyroid nodules or tenderness, no JVD, normal carotid upstroke, no cervical lymphadenopathy, and no neck tenderness.   Lungs:  normal respiratory effort, no intercostal retractions, no accessory muscle use, normal breath sounds, no dullness, no fremitus, no crackles, and no wheezes.   Heart:  normal rate, regular rhythm, no murmur, no gallop, no rub, and no JVD.   Abdomen:  Bowel sounds positive,abdomen soft and non-tender without masses, organomegaly or hernias noted. Msk:  normal ROM, no joint tenderness, no joint swelling, no joint warmth, no redness over  joints, no joint deformities, no joint instability, and no crepitation.   Pulses:  R and L carotid,radial,femoral,dorsalis pedis and posterior tibial pulses are full and equal bilaterally Extremities:  mild  clubbing, but no  cyanosis, edema, or deformity noted with normal full range of motion of all joints.   Neurologic:  alert & oriented X3, cranial nerves II-XII intact, strength normal in all extremities, sensation intact to light touch, sensation intact to pinprick, gait normal, and DTRs symmetrical and normal.   Skin:  diffuse ezcema with erythema- 50% more  than before, scaling, lichenification, no suspicious lesions, no ecchymoses, no petechiae, no purpura, no ulcerations, and no edema.  there is no oozing, erythema, or wounds Cervical Nodes:  no anterior cervical adenopathy and no posterior cervical adenopathy.   Axillary Nodes:  no R axillary adenopathy and no L axillary adenopathy.   Inguinal Nodes:  no R inguinal adenopathy and no L inguinal adenopathy.   Psych:  Cognition and judgment appear intact. Alert and cooperative with normal attention span and concentration. No apparent delusions, illusions, hallucinations   Impression & Recommendations:  Problem # 1:  FATIGUE (ICD-780.79) Assessment New labs ordered to look for secondary causes (thyroid, anemia, lytes, inflammation, infection)  Problem # 2:  ECZEMA, ATOPIC (ICD-691.8) Assessment: Deteriorated Mom will restart Eucerin. It does not sound like the Derm. referral was very helpful so will get an Allergy referral. He will continue with Claritin or Zyrtec, his choice. His updated medication list for this problem includes:    Topicort 0.25 % Crea (Desoximetasone) .Marland Kitchen... Apply to rash two times a day, ok to fill today  Orders: Allergy Referral  (Allergy)  Problem # 3:  RASH AND OTHER NONSPECIFIC SKIN ERUPTION (ICD-782.1) Assessment: Deteriorated  His updated medication list for this problem includes:    Topicort 0.25 % Crea  (Desoximetasone) .Marland Kitchen... Apply to rash two times a day, ok to fill today  Orders: Venipuncture ([address]) TLB-BMP (Basic Metabolic Panel-BMET) (80048-METABOL) TLB-CBC Platelet - w/Differential (85025-CBCD) TLB-Hepatic/Liver Function Pnl (80076-HEPATIC) TLB-TSH (Thyroid Stimulating Hormone) (84443-TSH) TLB-Sedimentation Rate (ESR) (85652-ESR)  Complete Medication List: 1)  Topicort 0.25 % Crea (Desoximetasone) .... Apply to rash two times a day, ok to fill today 2)  Loratadine 10 Mg Tabs (Loratadine) .Marland Kitchen.. 1 by mouth qd  Other Orders: Admin of Therapeutic Inj  intramuscular or subcutaneous (60454) Depo- Medrol 40mg  (J1030) Depo- Medrol 80mg  (J1040)  Patient Instructions: 1)  Please schedule a follow-up appointment in 2 weeks.   Medication Administration  Injection # 1:    Medication: Depo- Medrol 80mg     Diagnosis: ALLERGIC RHINITIS CAUSE UNSPECIFIED (ICD-477.9)    Route: IM    Site: R deltoid    Exp Date: 04/2012    Lot #: [id number]    Mfr: pfizer    Patient tolerated injection without complications    Given by: Rock Nephew CMA (Oct 20, 2009 9:21 AM)  Injection #  2:    Medication: Depo- Medrol 40mg     Diagnosis: ALLERGIC RHINITIS CAUSE UNSPECIFIED (ICD-477.9)    Route: IM    Site: R deltoid    Exp Date: 04/2012    Lot #: [id number]    Mfr: pfizer    Patient tolerated injection without complications    Given by: Rock Nephew CMA (Oct 20, 2009 9:21 AM)  Orders Added: 1)  Allergy Referral  [Allergy] 2)  Venipuncture [13086] 3)  TLB-BMP (Basic Metabolic Panel-BMET) [80048-METABOL] 4)  TLB-CBC Platelet - w/Differential [85025-CBCD] 5)  TLB-Hepatic/Liver Function Pnl [80076-HEPATIC] 6)  TLB-TSH (Thyroid Stimulating Hormone) [84443-TSH] 7)  TLB-Sedimentation Rate (ESR) [85652-ESR] 8)  Admin of Therapeutic Inj  intramuscular or subcutaneous [96372] 9)  Depo- Medrol 40mg  [J1030] 10)  Depo- Medrol 80mg  [J1040] 11)  Est. Patient Level IV [57846]

## 2010-07-13 NOTE — Assessment & Plan Note (Signed)
Summary: eczema atopic/jd   Vital Signs:  Patient profile:   20 year old male Height:      71 inches Weight:      131 pounds BMI:     18.34 O2 Sat:      98 % on Room air Pulse rate:   91 / minute BP sitting:   130 / 80  (left arm) Cuff size:   regular  Vitals Entered By: [name] CMA (November 23, 2009 3:59 PM)  O2 Flow:  Room air  Primary Provider/Referring Provider:  Etta Grandchild MD   History of Present Illness: November 23, 2009- 18 yo M nonsmoker here with his father on referral by Dr Yetta Barre for allergy evaluation because of asthma, rhinitis and eczema. His father does most of the talking. These problems appeared in childhood. Allergy skin tested at age 88 and on vaccine at that time. Never used inhalers reliably. Skin treated with Elidel-didn't work, triamcinolone- works when used and doing better now than usual. Itching treated with hydroxyzine, Xyzal, zyrtec, fexofenadine, loratadine, Sulfa antibioitic.Marland Kitchen Has seen several dermatologists. Sneeze and cough are better currently, but he has "a cold all the time". Triggers- 'seasona change", pollen, animals. Don't recognize particular environmental exposures otherwise. Parents are separated. He was living with mother during the week- she smokes and has smoking guests. Was staying with father on weekends- no smoking. Willl be staying with father this summer. Tonsils out. Foreign body removed from nose age 64. Loud snore per father, and daytime sleepiness. Father would like to have sleep study done. Previously treated for ADHD Lab- High IgE 1548.6 with broadly elevated specific IgE levels for allergen panel components. WBC 14,400. Deny GERD.   Asthma History    Asthma Control Assessment:    Age range: 12+ years    Symptoms: 0-2 days/week    Nighttime Awakenings: 0-2/month    Interferes w/ normal activity: some limitations    SABA use (not for EIB): 0-2 days/week    Asthma Control Assessment: Not Well Controlled   Preventive  Screening-Counseling & Management  Alcohol-Tobacco     Smoking Status: never  Current Medications (verified): 1)  Topicort 0.25 % Crea (Desoximetasone) .... Apply To Rash Two Times A Day, Ok To Fill Today 2)  Loratadine 10 Mg Tabs (Loratadine) .Marland Kitchen.. 1 By Mouth Qd  Allergies (verified): No Known Drug Allergies  Past History:  Past Medical History: eczema Asthma Allergic Rhinitis  Family History: Grandmother-emphysema Father-hx of blood clot about 1 year ago(truck driver). Father- eczema  Review of Systems      See HPI       The patient complains of non-productive cough, headaches, nasal congestion/difficulty breathing through nose, sneezing, itching, joint stiffness or pain, and rash.  The patient denies shortness of breath with activity, shortness of breath at rest, productive cough, coughing up blood, chest pain, irregular heartbeats, acid heartburn, indigestion, loss of appetite, weight change, abdominal pain, difficulty swallowing, sore throat, tooth/dental problems, ear ache, anxiety, depression, hand/feet swelling, change in color of mucus, and fever.    Physical Exam  Additional Exam:  General: A/Ox3; pleasant and cooperative, NAD, very quiet- allowed father to speak for him SKIN: hypopigmented areas c/w old eczematoid scarring. Dry, thickend skin antecubital NODES: no lymphadenopathy HEENT: [place]/AT, EOM- WNL, Conjuctivae- clear, PERRLA, TM-WNL, Nose- clear, no polyps, Throat- clear and wnl, Mallampati  III NECK: Supple w/ fair ROM, JVD- none, normal carotid impulses w/o bruits Thyroid- normal to palpation CHEST: Clear to P&A HEART: RRR, no m/g/r  heard ABDOMEN: Soft and nl; nml bowel sounds; no organomegaly or masses noted ZOX:WRUE, nl pulses, no edema  NEURO: Grossly intact to observation. Small eyes, flattened nose- question developmental delay.(? syndrome?)      Impression & Recommendations:  Problem # 1:  FATIGUE (ICD-780.79) Father is concerned about his  interrupted snoring and possible sleep apnea with prior dx of ADHD. We will get sleep study done for ? sleep apnea.  Problem # 2:  ECZEMA, ATOPIC (ICD-691.8)  Chronic eczema. Discussed hydration, nail care, available meds, recognigtion of skin infections. They feel sparing use of triamcinolone is working as well now as anything.  Problem # 3:  ALLERGIC RHINITIS CAUSE UNSPECIFIED (ICD-477.9)  Currently about as good as he gets. Spring apparently can be pretty bad. They agree to skin test for clarification and consideration of allergy vaccine. He may react too broadly to evaluate. His updated medication list for this problem includes:    Loratadine 10 Mg Tabs (Loratadine) .Marland Kitchen... 1 by mouth qd  Problem # 4:  ASTHMA (ICD-493.90) His IgE is probably out of range for consideration of Xolair. Schedule PFT.  He is frank that if he had inhalers he wouldn't bother to use them. Education will take time. Environmental issues begin with dust avoidance, but are complicated by times spent in mother's home with no effort there to avoid smoke exposure as described.  Other Orders: Consultation Level IV (45409) DME Referral (DME) Sleep Disorder Referral (Sleep Disorder)  Patient Instructions: 1)  Return as able for allergy skin test. Stop all antihistamines 3 days before skin testing, including cold and allergy meds, otc sleep and cough meds. This includes loratadine. 2)  See [facility] to schedule sleep study to look for sleep apnea 3)  Schedule PFT 4)  Sample rescue inhaler: 2 puffs up to 4 times daily if needed

## 2010-07-13 NOTE — Assessment & Plan Note (Signed)
Summary: SINUS DRAINAGE AND CHEST CONGEST  STC   Vital Signs:  Patient profile:   20 year old male Height:      71 inches Weight:      127 pounds BMI:     17.78 O2 Sat:      99 % on Room air Temp:     98.1 degrees F oral Pulse rate:   90 / minute Pulse rhythm:   regular Resp:     16 per minute BP sitting:   118 / 78  (left arm) Cuff size:   regular  Vitals Entered By: Rock Nephew CMA (September 30, 2009 3:14 PM)  O2 Flow:  Room air CC: sinus congestion w/ headache, URI symptoms   Primary Care Provider:  Etta Grandchild MD  CC:  sinus congestion w/ headache and URI symptoms.  History of Present Illness:  URI Symptoms      This is a 20 year old man who presents with URI symptoms.  The symptoms began 2 weeks ago.  The severity is described as moderate.  The patient reports nasal congestion, clear nasal discharge, purulent nasal discharge, and sore throat, but denies dry cough, productive cough, earache, and sick contacts.  The patient denies fever, stiff neck, dyspnea, wheezing, vomiting, diarrhea, use of an antipyretic, and response to antipyretic.  The patient also reports seasonal symptoms.  The patient denies headache, muscle aches, and severe fatigue.  Risk factors for Strep sinusitis include unilateral facial pain, unilateral nasal discharge, and poor response to decongestant.  The patient denies the following risk factors for Strep sinusitis: Strep exposure, tender adenopathy, and absence of cough.    His skin is much better. He and his grandfather today say that he has seen a Dermatologist (? who) and he is on some meds. (? what they are).  Preventive Screening-Counseling & Management  Alcohol-Tobacco     Alcohol drinks/day: 0     Smoking Status: never     Passive Smoke Exposure: yes     Passive Smoke Counseling: to avoid passive smoke exposure  Hep-HIV-STD-Contraception     Hepatitis Risk: no risk noted     HIV Risk: no risk noted     STD Risk: no risk  noted  Medications Prior to Update: 1)  Xyzal 5 Mg Tabs (Levocetirizine Dihydrochloride) .... Once Daily For Rash and Itching 2)  Topicort 0.25 % Crea (Desoximetasone) .... Apply To Rash Two Times A Day, Ok To Fill Today  Current Medications (verified): 1)  Topicort 0.25 % Crea (Desoximetasone) .... Apply To Rash Two Times A Day, Ok To Fill Today 2)  Fexofenadine Hcl 180 Mg Tabs (Fexofenadine Hcl) .... One By Mouth Once Daily For Allergies 3)  Sulfamethoxazole-Tmp Ds 800-160 Mg Tab (Trimethoprim-Sulfamethoxazole) .... Take 1 Tablet By Mouth Morning and Night For 10 Days  Allergies (verified): No Known Drug Allergies  Past History:  Past Medical History: Reviewed history from 03/26/2009 and no changes required. eczema Asthma  Past Surgical History: Reviewed history from 03/26/2009 and no changes required. Denies surgical history  Social History: Reviewed history from 03/26/2009 and no changes required. 11 th grade Never Smoked Alcohol use-no Drug use-no Regular exercise-yes  Review of Systems  The patient denies anorexia, fever, weight loss, weight gain, chest pain, syncope, dyspnea on exertion, peripheral edema, prolonged cough, headaches, hemoptysis, abdominal pain, suspicious skin lesions, enlarged lymph nodes, and angioedema.    Physical Exam  General:  alert, well-developed, well-nourished, well-hydrated, appropriate dress, normal appearance, healthy-appearing, and cooperative to  examination.   Head:  normocephalic and atraumatic.   Eyes:  No corneal or conjunctival inflammation noted. EOMI. Perrla. Funduscopic exam benign, without hemorrhages, exudates or papilledema. Vision grossly normal. Ears:  R ear normal and L ear normal.   Nose:  no airflow obstruction, no intranasal foreign body, no mucosal friability, no active bleeding or clots, no sinus percussion tenderness, no septum abnormalities, nasal dischargemucosal pallor, mucosal edema, and R nasal polyp.   Mouth:   good dentition, no exudates, no posterior lymphoid hypertrophy, no postnasal drip, no pharyngeal crowing, no lesions, no aphthous ulcers, no erosions, no tongue abnormalities, no leukoplakia, no petechiae, and pharyngeal erythema.   Neck:  supple, full ROM, no masses, no cervical lymphadenopathy, and no neck tenderness.   Lungs:  Normal respiratory effort, chest expands symmetrically. Lungs are clear to auscultation, no crackles or wheezes. Heart:  Normal rate and regular rhythm. S1 and S2 normal without gallop, murmur, click, rub or other extra sounds. Abdomen:  soft, non-tender, normal bowel sounds, no distention, no masses, no guarding, no rigidity, no rebound tenderness, no hepatomegaly, and no splenomegaly.   Msk:  he has very mild clubbing, normal ROM, no joint tenderness, no joint swelling, no joint warmth, no redness over joints, no joint deformities, no joint instability, and no crepitation.   Pulses:  R and L carotid,radial,femoral,dorsalis pedis and posterior tibial pulses are full and equal bilaterally Extremities:  mild  clubbing, but no  cyanosis, edema, or deformity noted with normal full range of motion of all joints.   Neurologic:  alert & oriented X3, cranial nerves II-XII intact, strength normal in all extremities, sensation intact to light touch, sensation intact to pinprick, gait normal, and DTRs symmetrical and normal.   Skin:  diffuse ezcema with erythema- 50% less than before, scaling, lichenification, no suspicious lesions, no ecchymoses, no petechiae, no purpura, no ulcerations, and no edema.  there is no oozing, erythema, or wounds Cervical Nodes:  no anterior cervical adenopathy and no posterior cervical adenopathy.   Axillary Nodes:  no R axillary adenopathy and no L axillary adenopathy.   Psych:  Cognition and judgment appear intact. Alert and cooperative with normal attention span and concentration. No apparent delusions, illusions, hallucinations   Impression &  Recommendations:  Problem # 1:  SINUSITIS- ACUTE-NOS (ICD-461.9) Assessment New  His updated medication list for this problem includes:    Sulfamethoxazole-tmp Ds 800-160 Mg Tab (Trimethoprim-sulfamethoxazole) .Marland Kitchen... 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 Apply to rash two times a day, ok to fill today  Discussed use of medication and avoidance of irritating agents. Also stressed importance of moisturizers.   Problem # 4:  ALLERGIC RHINITIS CAUSE UNSPECIFIED (ICD-477.9) Assessment: Unchanged  The following medications were removed from the medication list:    Xyzal 5 Mg Tabs (Levocetirizine dihydrochloride) ..... Once daily for rash and itching His updated medication list for this problem includes:    Fexofenadine Hcl 180 Mg Tabs (Fexofenadine hcl) ..... One by mouth once daily for allergies  Discussed use of allergy medications and environmental measures.   Complete Medication List: 1)  Topicort 0.25 %  Crea (Desoximetasone) .... Apply to rash two times a day, ok to fill today 2)  Fexofenadine Hcl 180 Mg Tabs (Fexofenadine hcl) .... One by mouth once daily for allergies 3)  Sulfamethoxazole-tmp Ds 800-160 Mg Tab (Trimethoprim-sulfamethoxazole) .... Take 1 tablet by mouth morning and night for 10 days  Patient Instructions: 1)  Please schedule a follow-up appointment in 1 month. It looks like he has a polyp on the right nose which is a benign growth from the membranes of the nose caused by chronic allergies. It looks like it is large enough to cause him problems with congestion and  infection so i have made a referral to an Ear, Nose, and Throat specialist today. Prescriptions: TOPICORT 0.25 % CREA (DESOXIMETASONE) Apply to rash two times a day, ok to fill today  #200 x 11   Entered and Authorized by:   Etta Grandchild MD   Signed by:   Etta Grandchild MD on 09/30/2009   Method used:   Print then Give to Patient   RxID:   [id number] SULFAMETHOXAZOLE-TMP DS 800-160 MG TAB (TRIMETHOPRIM-SULFAMETHOXAZOLE) Take 1 tablet by mouth morning and night for 10 days  #20 x 1   Entered and Authorized by:   Etta Grandchild MD   Signed by:   Etta Grandchild MD on 09/30/2009   Method used:   Print then Give to Patient   RxID:   [id number] FEXOFENADINE HCL 180 MG TABS (FEXOFENADINE HCL) One by mouth once daily for allergies  #30 x 11   Entered and Authorized by:   Etta Grandchild MD   Signed by:   Etta Grandchild MD on 09/30/2009   Method used:   Print then Give to Patient   RxID:   [id number]    Medication Administration  Injection # 1:    Medication: Depo- Medrol 80mg     Diagnosis: SINUSITIS- ACUTE-NOS (ICD-461.9)    Route: IM    Site: 06/2010    Exp Date: 06/2010    Lot #: [id number]    Mfr: pfizer    Patient tolerated injection without complications    Given by: Rock Nephew CMA (September 30, 2009 4:10 PM)  Injection # 2:    Medication: Depo- Medrol 40mg     Diagnosis: SINUSITIS- ACUTE-NOS (ICD-461.9)    Route: IM    Site: 06/2010    Exp Date: 06/2010    Lot #: [id number]    Patient tolerated injection without complications    Given by: Rock Nephew CMA (September 30, 2009 4:10 PM)  Orders Added: 1)  ENT Referral [ENT] 2)  Admin of Therapeutic Inj  intramuscular or subcutaneous [96372] 3)  Depo- Medrol 40mg  [J1030] 4)  Depo- Medrol 80mg  [J1040] 5)  Est. Patient Level IV [01027]

## 2010-07-13 NOTE — Progress Notes (Signed)
Summary: PA?Med change  Phone Note From Pharmacy   Caller: [facility]  [address] [phone]* Summary of Call: Rec'd fax from pharmacy to call for PA--Levocetirizine  OR change prescription to Claritin/Zyrtec. Please advise. Initial call taken by: Lucious Groves,  June 26, 2009 4:51 PM  Follow-up for Phone Call        yes, those are OTC so Mom can pick some up, generic is fine Follow-up by: Etta Grandchild MD,  June 26, 2009 4:52 PM  Additional Follow-up for Phone Call Additional follow up Details #1::        Which one would you prefer we send in for patient? Additional Follow-up by: Lucious Groves,  June 29, 2009 8:06 AM    Additional Follow-up for Phone Call Additional follow up Details #2::    zyrtec Follow-up by: Etta Grandchild MD,  June 29, 2009 8:08 AM  Additional Follow-up for Phone Call Additional follow up Details #3:: Details for Additional Follow-up Action Taken: sent electronically. Additional Follow-up by: Lucious Groves,  June 29, 2009 2:46 PM  New/Updated Medications: ZYRTEC ALLERGY 10 MG CAPS (CETIRIZINE HCL) 1 by mouth daily Prescriptions: ZYRTEC ALLERGY 10 MG CAPS (CETIRIZINE HCL) 1 by mouth daily  #30 x 3   Entered by:   Lucious Groves   Authorized by:   Etta Grandchild MD   Signed by:   Lucious Groves on 06/29/2009   Method used:   Electronically to        [facility] [phone]* (retail)       [address]       [place], [place]  [address]       Ph: [phone]       Fax: [phone]   RxID:   [id number]

## 2010-07-13 NOTE — Assessment & Plan Note (Signed)
Summary: ?allergies/#/cd   Vital Signs:  Patient profile:   20 year old male Height:      71 inches Weight:      125 pounds BMI:     17.50 O2 Sat:      99 % on Room air Temp:     96.6 degrees F oral Pulse rate:   80 / minute Pulse rhythm:   regular Resp:     16 per minute BP sitting:   100 / 80  (left arm) Cuff size:   regular  Vitals Entered By: Alysia Penna (March 01, 2010 2:28 PM)  O2 Flow:  Room air CC: pt c/o having problems with allergies for a week. Having congestion, cough, & sneezing. /cp sma, URI symptoms   Primary Care Provider:  Etta Grandchild MD  CC:  pt c/o having problems with allergies for a week. Having congestion, cough, & sneezing. /cp sma, and URI symptoms.  History of Present Illness:  URI Symptoms      This is a 20 year old man who presents with URI symptoms.  The symptoms began 2 weeks ago.  The severity is described as moderate.  The patient reports nasal congestion, clear nasal discharge, purulent nasal discharge, and dry cough, but denies sore throat, productive cough, earache, and sick contacts.  The patient denies fever, stiff neck, dyspnea, wheezing, rash, vomiting, diarrhea, use of an antipyretic, and response to antipyretic.  The patient also reports sneezing, seasonal symptoms, and response to antihistamine.  The patient denies itchy watery eyes, itchy throat, headache, muscle aches, and severe fatigue.  The patient denies the following risk factors for Strep sinusitis: unilateral facial pain, unilateral nasal discharge, poor response to decongestant, double sickening, tooth pain, Strep exposure, tender adenopathy, and absence of cough.    Preventive Screening-Counseling & Management  Alcohol-Tobacco     Alcohol drinks/day: 0     Smoking Status: never     Passive Smoke Exposure: yes     Passive Smoke Counseling: to avoid passive smoke exposure  Hep-HIV-STD-Contraception     Hepatitis Risk: no risk noted     HIV Risk: no risk noted   STD Risk: no risk noted      Drug Use:  no.        Blood Transfusions:  no.    Medications Prior to Update: 1)  Topicort 0.25 % Crea (Desoximetasone) .... Apply To Rash Two Times A Day, Ok To Fill Today 2)  Loratadine 10 Mg Tabs (Loratadine) .Marland Kitchen.. 1 By Mouth Qd  Current Medications (verified): 1)  Topicort 0.25 % Crea (Desoximetasone) .... Apply To Rash Two Times A Day, Ok To Fill Today 2)  Loratadine 10 Mg Tabs (Loratadine) .Marland Kitchen.. 1 By Mouth Qd 3)  Sulfamethoxazole-Tmp Ds 800-160 Mg Tab (Trimethoprim-Sulfamethoxazole) .... Take 1 Tablet By Mouth Morning and Night  Allergies (verified): No Known Drug Allergies  Past History:  Past Medical History: Last updated: 11/23/2009 eczema Asthma Allergic Rhinitis  Past Surgical History: Last updated: 03/26/2009 Denies surgical history  Family History: Last updated: 11/23/2009 Grandmother-emphysema Father-hx of blood clot about 1 year ago(truck driver). Father- eczema  Social History: Last updated: 03/26/2009 11 th grade Never Smoked Alcohol use-no Drug use-no Regular exercise-yes  Risk Factors: Alcohol Use: 0 (03/01/2010) Exercise: yes (03/26/2009)  Risk Factors: Smoking Status: never (03/01/2010) Passive Smoke Exposure: yes (03/01/2010)  Family History: Reviewed history from 11/23/2009 and no changes required. Grandmother-emphysema Father-hx of blood clot about 1 year ago(truck driver). Father- eczema  Social History: Reviewed  history from 03/26/2009 and no changes required. 11 th grade Never Smoked Alcohol use-no Drug use-no Regular exercise-yes  Review of Systems  The patient denies anorexia, fever, weight loss, weight gain, hoarseness, chest pain, syncope, dyspnea on exertion, peripheral edema, prolonged cough, headaches, hemoptysis, abdominal pain, hematuria, suspicious skin lesions, enlarged lymph nodes, and angioedema.    Physical Exam  General:  alert, well-developed, well-nourished, well-hydrated,  and underweight appearing.   Head:  scaling and excoriation over the scalp diffusely. no exudates or fissuring. no induration or streaking. Ears:  R ear normal and L ear normal.   Nose:  no external deformity, no airflow obstruction, no intranasal foreign body, no nasal polyps, no nasal mucosal lesions, no mucosal friability, no active bleeding or clots, no sinus percussion tenderness, no septum abnormalities, nasal dischargemucosal pallor, and mucosal edema.   Mouth:  Oral mucosa and oropharynx without lesions or exudates.  Teeth in good repair. Skin:  diffuse ezcema with erythema- 50% less  than before, scaling, lichenification, no suspicious lesions, no ecchymoses, no petechiae, no purpura, no ulcerations, and no edema.  there is no oozing, erythema, or wounds Cervical Nodes:  no anterior cervical adenopathy and no posterior cervical adenopathy.   Axillary Nodes:  no R axillary adenopathy and no L axillary adenopathy.   Inguinal Nodes:  no R inguinal adenopathy and no L inguinal adenopathy.   Psych:  Cognition and judgment appear intact. Alert and cooperative with normal attention span and concentration. No apparent delusions, illusions, hallucinations   Impression & Recommendations:  Problem # 1:  ALLERGIC RHINITIS (ICD-477.9) Assessment Deteriorated  His updated medication list for this problem includes:    Loratadine 10 Mg Tabs (Loratadine) .Marland Kitchen... 1 by mouth qd  Problem # 2:  SINUSITIS- ACUTE-NOS (ICD-461.9) Assessment: New  His updated medication list for this problem includes:    Sulfamethoxazole-tmp Ds 800-160 Mg Tab (Trimethoprim-sulfamethoxazole) .Marland Kitchen... Take 1 tablet by mouth morning and night  Instructed on treatment. Call if symptoms persist or worsen.   Orders: Depo- Medrol 40mg  (J1030) Depo- Medrol 80mg  (J1040) Admin of Therapeutic Inj  intramuscular or subcutaneous (16109)  Problem # 3:  ECZEMA, ATOPIC (ICD-691.8) Assessment: Improved  His updated medication list for  this problem includes:    Topicort 0.25 % Crea (Desoximetasone) .Marland Kitchen... Apply to rash two times a day, ok to fill today  Discussed use of medication and avoidance of irritating agents. Also stressed importance of moisturizers.   Complete Medication List: 1)  Topicort 0.25 % Crea (Desoximetasone) .... Apply to rash two times a day, ok to fill today 2)  Loratadine 10 Mg Tabs (Loratadine) .Marland Kitchen.. 1 by mouth qd 3)  Sulfamethoxazole-tmp Ds 800-160 Mg Tab (Trimethoprim-sulfamethoxazole) .... Take 1 tablet by mouth morning and night  Other Orders: Tdap => 61yrs IM (60454) Admin 1st Vaccine (09811)  Patient Instructions: 1)  Please schedule a follow-up appointment in 2 weeks. 2)  Take your antibiotic as prescribed until ALL of it is gone, but stop if you develop a rash or swelling and contact our office as soon as possible. 3)  Acute sinusitis symptoms for less than 10 days are not helped by antibiotics.Use warm moist compresses, and over the counter decongestants ( only as directed). Call if no improvement in 5-7 days, sooner if increasing pain, fever, or new symptoms. Prescriptions: SULFAMETHOXAZOLE-TMP DS 800-160 MG TAB (TRIMETHOPRIM-SULFAMETHOXAZOLE) Take 1 tablet by mouth morning and night  #20 x 0   Entered and Authorized by:   Etta Grandchild MD   Signed  by:   Etta Grandchild MD on 03/01/2010   Method used:   Print then Give to Patient   RxID:   [id number]    Medication Administration  Injection # 1:    Medication: Depo- Medrol 40mg     Diagnosis: SINUSITIS- ACUTE-NOS (ICD-461.9)    Route: IM    Site: L deltoid    Exp Date: 09/11/2012    Lot #: [id number]    Mfr: Pharmacia    Patient tolerated injection without complications    Given by: Lamar Sprinkles, CMA (March 01, 2010 3:11 PM)  Injection # 2:    Medication: Depo- Medrol 80mg     Diagnosis: SINUSITIS- ACUTE-NOS (ICD-461.9)    Comments: Same as above    Patient tolerated injection without complications    Given by: Lamar Sprinkles, CMA (March 01, 2010 3:12 PM)  Orders Added: 1)  Depo- Medrol 40mg  [J1030] 2)  Depo- Medrol 80mg  [J1040] 3)  Admin of Therapeutic Inj  intramuscular or subcutaneous [96372] 4)  Tdap => 50yrs IM [90715] 5)  Admin 1st Vaccine [90471] 6)  Est. Patient Level IV [43329]    Immunizations Administered:  Tetanus Vaccine:    Vaccine Type: Tdap    Site: right buttock    Mfr: GlaxoSmithKline    Dose: 0.5 ml    Route: IM    Given by: Lamar Sprinkles, CMA    Exp. Date: 02/13/2012    Lot #: [id number]    VIS given: 04/30/08 version given March 01, 2010.

## 2010-07-15 NOTE — Letter (Signed)
Summary: [employer] application  [employer] application   Imported By: Lester Salvo 06/25/2010 07:23:48  _____________________________________________________________________  External Attachment:    Type:   Image     Comment:   External Document

## 2010-07-23 ENCOUNTER — Encounter: Payer: Self-pay | Admitting: Internal Medicine

## 2010-07-23 ENCOUNTER — Ambulatory Visit (HOSPITAL_BASED_OUTPATIENT_CLINIC_OR_DEPARTMENT_OTHER): Payer: Medicaid Other | Attending: Internal Medicine

## 2010-07-23 DIAGNOSIS — G4710 Hypersomnia, unspecified: Secondary | ICD-10-CM | POA: Insufficient documentation

## 2010-07-24 DIAGNOSIS — G4730 Sleep apnea, unspecified: Secondary | ICD-10-CM

## 2010-07-24 DIAGNOSIS — G4710 Hypersomnia, unspecified: Secondary | ICD-10-CM

## 2010-08-24 ENCOUNTER — Encounter: Payer: Self-pay | Admitting: Internal Medicine

## 2010-08-24 ENCOUNTER — Telehealth: Payer: Self-pay | Admitting: Internal Medicine

## 2010-08-31 NOTE — Letter (Signed)
Summary: Application/[employer]  Application/[employer]   Imported By: Sherian Rein 08/26/2010 08:42:43  _____________________________________________________________________  External Attachment:    Type:   Image     Comment:   External Document

## 2010-08-31 NOTE — Progress Notes (Signed)
Summary: insurance/new dr  Phone Note Call from Patient Call back at Home Phone [phone]   Caller: Mom Reason for Call: Talk to Nurse Summary of Call: pt Mother request call back to see if Dr Yetta Barre knows another dr that will accept [facility], that he would recommend Initial call taken by: Migdalia Dk,  August 24, 2010 11:11 AM

## 2010-09-27 ENCOUNTER — Ambulatory Visit: Payer: BC Managed Care – PPO | Admitting: Internal Medicine

## 2010-10-11 ENCOUNTER — Other Ambulatory Visit: Payer: Self-pay | Admitting: Internal Medicine

## 2010-10-29 NOTE — Op Note (Signed)
[place]. [facility]  Patient:    Roger Henderson, Roger Henderson                  MRN: [id number] Proc. Date: 06/16/00 Adm. Date:  [id number] Attending:  Lucky Cowboy CC:         [facility] Nose, [facility] Th[facility]  Merita Norton, M.D., [employer]   Operative Report  PREOPERATIVE DIAGNOSIS:  Right upper eyelid laceration.  POSTOPERATIVE DIAGNOSIS:  Right upper eyelid laceration.  PROCEDURE:  Closure of right upper eyelid laceration.  SURGEON:  Lucky Cowboy, M.D.  ANESTHESIA:  General mask.  ESTIMATED BLOOD LOSS:  None.  COMPLICATIONS:  None.  INDICATIONS:  The patient is an 20 year old male who a few minutes prior underwent adenotonsillectomy.  In the recovery room, he began thrashing about and incurred an injury from the rail of the bed, causing a laceration in the eyelid and some ecchymosis.  For this reason, he is returned to the operating room for closure of the laceration.  FINDINGS:  The patient was noted to have a 2 cm right upper eyelid laceration that extended toward the canthus but did not involve the lateral canthus.  The laceration extended through the skin, down to the orbicularis oculi muscle, but did not involve the muscle.  DESCRIPTION OF PROCEDURE:  The patient was taken to the operating room and placed on the table in a supine position.  He was then placed under general mask anesthesia and the right eyelid prepped with Betadine and draped in the usual sterile fashion.  The laceration was closed in a simple interrupted fashion using 6-0 Prolene.  Tobradex ophthalmic ointment was then applied to the wound.  The patient was awakened from anesthesia and taken to the postanesthesia care unit in stable condition.  There were no complications. DD:  06/16/00 TD:  06/16/00 Job: [id number]

## 2010-10-29 NOTE — Op Note (Signed)
[place]  Patient:    Roger Henderson, Roger Henderson                  MRN: [id number] Proc. Date: 06/16/00 Adm. Date:  [id number] Attending:  Lucky Cowboy CC:         [facility]  Merita Norton, M.D., [employer]   Operative Report  PREOPERATIVE DIAGNOSIS:  Obstructive sleep apnea.  POSTOPERATIVE DIAGNOSIS:  Obstructive sleep apnea.  PROCEDURE:  Adenotonsillectomy.  SURGEON:  Lucky Cowboy, M.D.  ANESTHESIA:  General.  ESTIMATED BLOOD LOSS: 40 cc.  SPECIMENS:  Adenoids and tonsils.  COMPLICATIONS:  None.  INDICATION:  This patient is an 20 year old male who has had very heavy mouth-breathing as well as apnea when asleep.  He was found to have 3+ bilateral palatine tonsils.  For these reasons, adenoidectomy and tonsillectomy are performed.  FINDINGS:  The patient was noted to have a moderate amount of adenoid hypertrophy.  There was a large pad; however, it did not completely obstruct the posterior choana.  There was a profuse amount of inferior turbinate hypertrophy with clear rhinorrhea.  The tonsils were 3+ and somewhat mealy in appearance.  There was no exudate.  DESCRIPTION OF PROCEDURE:  The patient was taken to the operating room and placed on the table in a supine position.  He was then placed under general endotracheal anesthesia and the table rotated counterclockwise 90 degrees. The neck was then gently extended using a shoulder roll.  The neck was then gently extended.  The eyes were taped shut.  The head and body were draped. Bacitracin ointment was placed on the lips.  The Crowe-Davis mouth gag with the #3 tongue blade was then placed intraorally, opened, and suspended on the Mayo stand.  Palpation of the soft palate was without evidence of a submucosal cleft.  A red rubber catheter was placed on the right nostril, brought out through the oral cavity, and secured in place with a hemostat.  Inspection  of the nasopharynx was performed using the mirror, and the mirror was also used to perform the remaining portion of the adenoidectomy.  A medium-sized adenoid curette was then placed against the vomer and directed inferiorly, thus severing the majority of the adenoid pad.  The remainder was removed using the curette, making subsequent passes along with using the tonsil and [place] forceps.  Three sterile gauze Afrin-soaked packs were placed in the nasopharynx and time allowed for hemostasis.  After allowing time for hemostasis, point hemostasis was achieved using suction cautery.  The red rubber catheter was removed and the nasal cavity copiously irrigated with normal saline, which was suctioned out through the oral cavity.  The tonsillectomy portion of the procedure was then performed.  The right palatine tonsil was grasped with Allis clamps and directed inferomedially. The Harmonic scalpel on a setting of level 3 was then used to excise the tonsil in the peritonsillar space, staying adjacent to the tonsillar capsule. The left palatine tonsil was removed in identical fashion.  In the midpole, there was some arterial bleeding, which was controlled using suction cautery. The mouth gag was relaxed and reopened, noting no residual bleeding.  An NG tube was placed down the esophagus for suctioning of the gastric contents. The patient was awakened from anesthesia and taken to the postanesthesia care unit in stable condition.  There were no complications. DD:  06/16/00 TD:  06/16/00 Job: [id number]

## 2011-01-10 IMAGING — CR DG CHEST 2V
2 series · 2 of 2 positions shown · non-contrast
Comparison: 03/27/2005

CLINICAL DATA: Cough

CHEST - 2 VIEW

[view not recorded (1 of 2)]
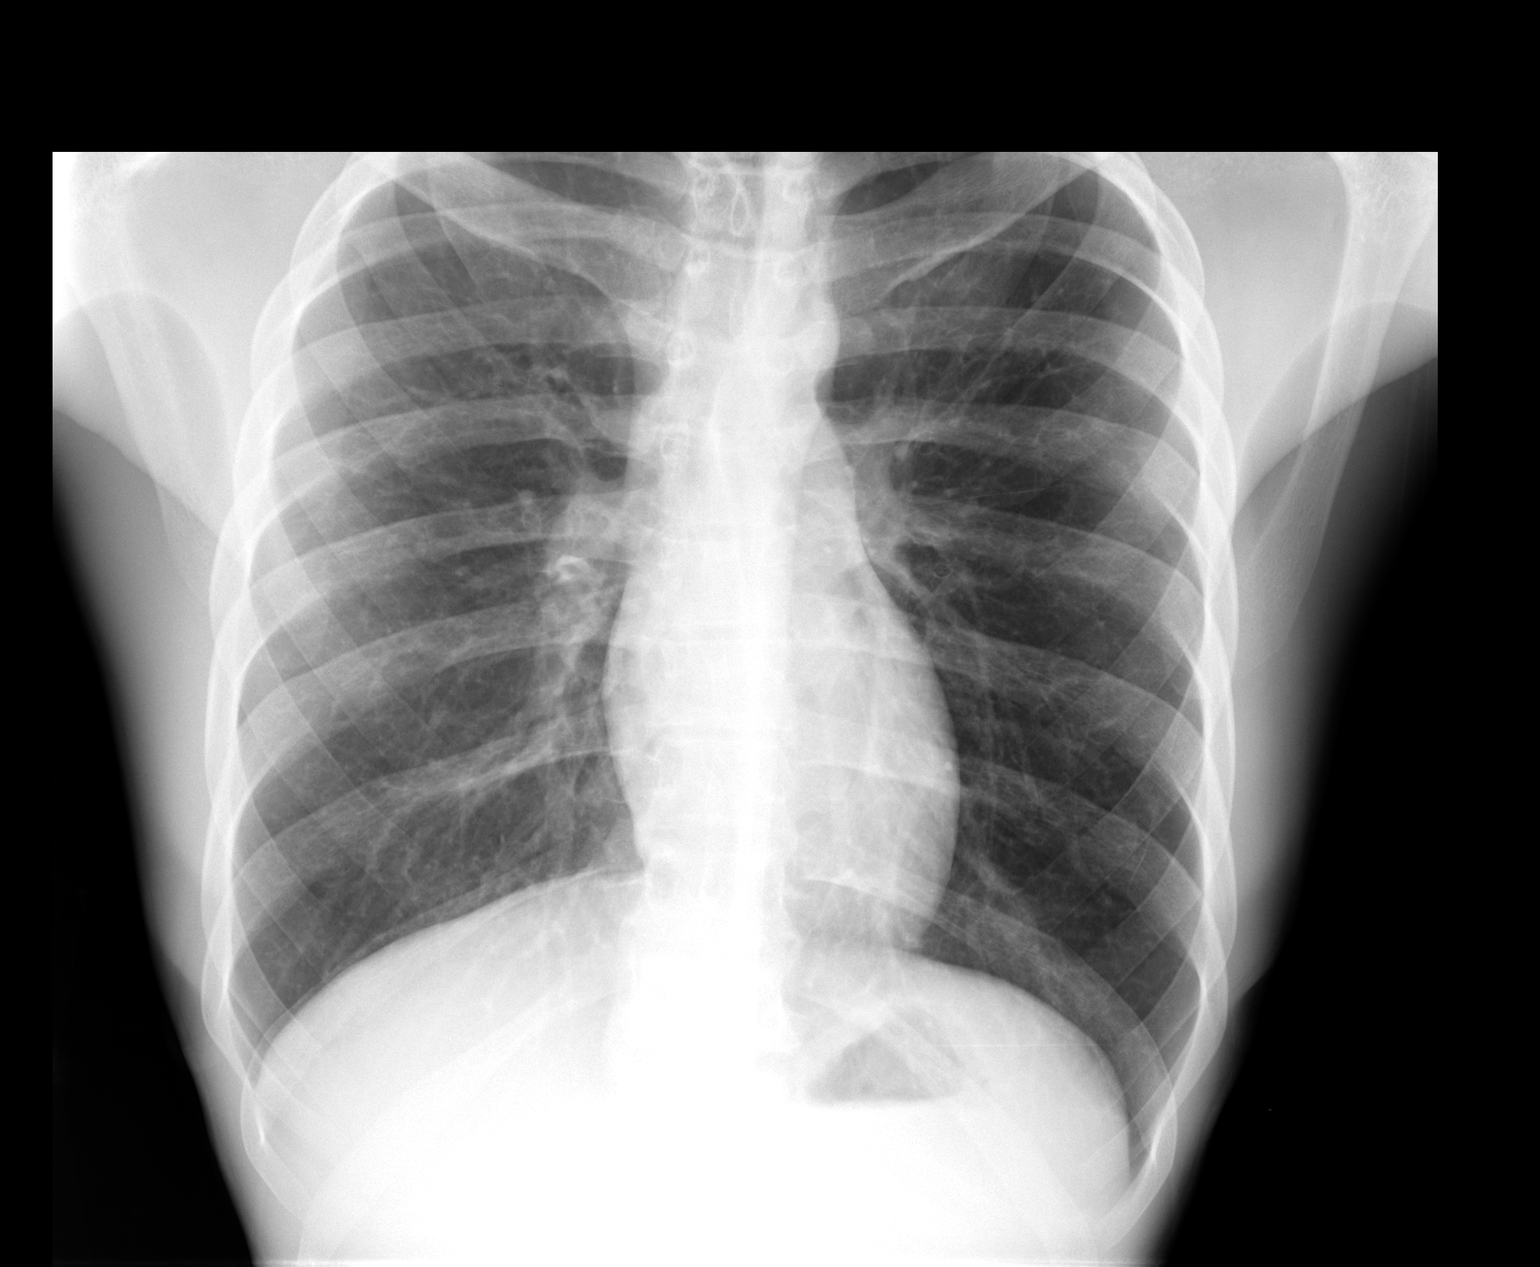

[view not recorded (2 of 2)]
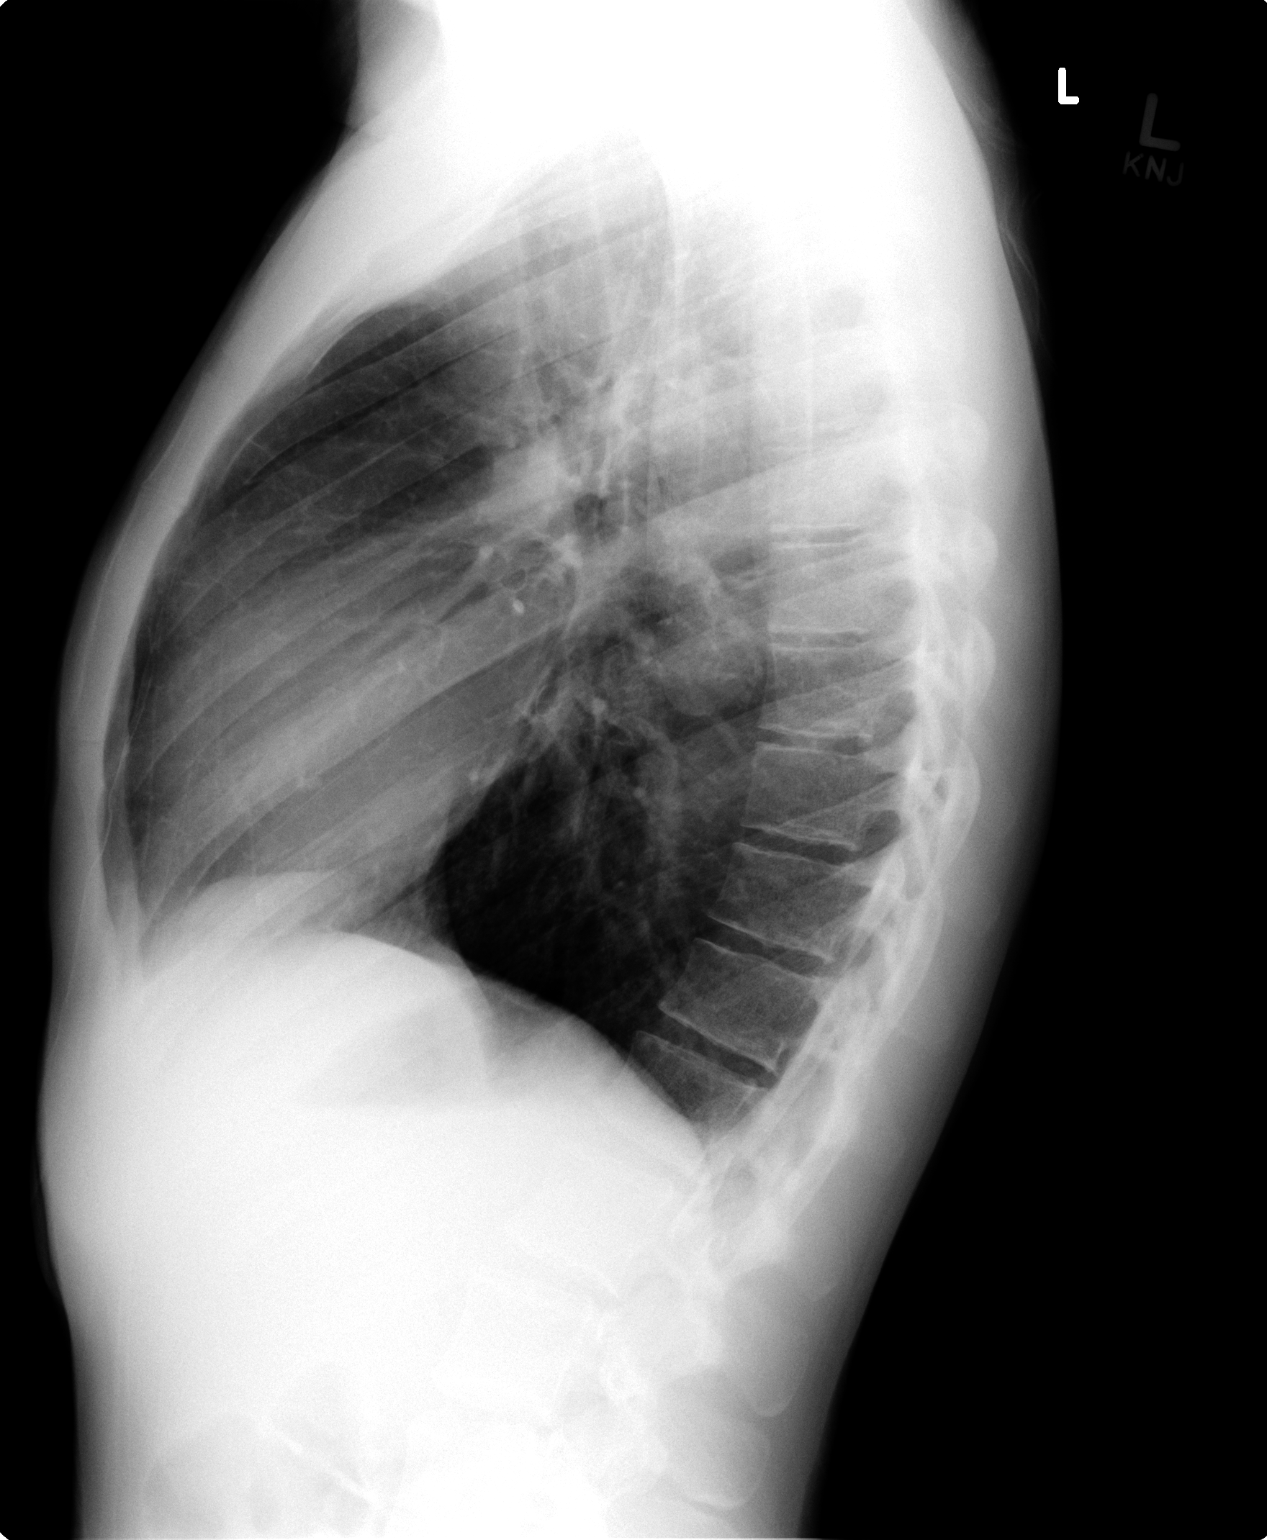

[2 of 2 positions shown; findings below may reference images not displayed]

FINDINGS: Mild hyperinflation without focal pneumonia, edema,
effusion or pneumothorax.  Midline trachea.  Normal heart size and
vascularity.  Intact bony thorax.
IMPRESSION: No current airspace disease.
Mild hyperinflation

## 2011-10-26 ENCOUNTER — Other Ambulatory Visit: Payer: Self-pay | Admitting: Internal Medicine

## 2013-06-29 ENCOUNTER — Emergency Department (HOSPITAL_COMMUNITY)
Admission: EM | Admit: 2013-06-29 | Discharge: 2013-06-29 | Disposition: A | Discharge status: Home / Self Care | Payer: Medicaid Other | Source: Home / Self Care | Attending: Emergency Medicine | Admitting: Emergency Medicine

## 2013-06-29 ENCOUNTER — Encounter (HOSPITAL_COMMUNITY): Payer: Self-pay | Admitting: Emergency Medicine

## 2013-06-29 DIAGNOSIS — L309 Dermatitis, unspecified: Secondary | ICD-10-CM

## 2013-06-29 DIAGNOSIS — L259 Unspecified contact dermatitis, unspecified cause: Secondary | ICD-10-CM

## 2013-06-29 MED ORDER — TRIAMCINOLONE ACETONIDE 0.1 % EX CREA: 1.0000 "application " | TOPICAL_CREAM | Freq: Two times a day (BID) | CUTANEOUS | Status: AC

## 2013-06-29 MED ORDER — METHYLPREDNISOLONE 4 MG PO KIT
PACK | ORAL | Status: AC
Start: 2013-06-29 — End: ?

## 2013-06-29 NOTE — ED Provider Notes (Signed)
CSN: [id number]     Arrival date & time 06/29/13  1107 History   First MD Initiated Contact with Patient 06/29/13 1238     Chief Complaint  Patient presents with  . Rash   (Consider location/radiation/quality/duration/timing/severity/associated sxs/prior Treatment) HPI Comments: 23 year old male with a history of eczema ran out of his medicines approximately 4 weeks ago and now is having exacerbation to include his face, neck, torso and upper extremities.  Patient is a 23 y.o. male presenting with rash.  Rash   Past Medical History  Diagnosis Date  . LEARNING DISABILITY 05/11/2010  . ECZEMA, ATOPIC 06/26/2009  . ASTHMA 03/27/2009  . Allergic rhinitis, cause unspecified 09/30/2009   History reviewed. No pertinent past surgical history. Family History  Problem Relation Age of Onset  . Eczema Father   . Emphysema Other     Grandmother   History  Substance Use Topics  . Smoking status: Never Smoker   . Smokeless tobacco: Not on file  . Alcohol Use:     Review of Systems  Constitutional: Negative.   Skin: Positive for rash.  All other systems reviewed and are negative.    Allergies  Review of patient's allergies indicates no known allergies.  Home Medications   Current Outpatient Rx  Name  Route  Sig  Dispense  Refill  . desoximetasone (TOPICORT) 0.25 % cream   Topical   Apply topically 2 (two) times daily.           . hydrOXYzine (ATARAX) 25 MG tablet   Oral   Take 25 mg by mouth every 6 (six) hours as needed. For itch          . loratadine (CLARITIN) 10 MG tablet      take 1 tablet by mouth once daily   30 tablet   0     OV required for further refills   . methylPREDNISolone (MEDROL DOSEPAK) 4 MG tablet      follow package directions   21 tablet   0   . triamcinolone cream (KENALOG) 0.1 %   Topical   Apply 1 application topically 2 (two) times daily. May use on face   85 g   0    BP 123/70  Pulse 63  Temp(Src) 97.9 F (36.6 C) (Oral)   Resp 16  SpO2 100% Physical Exam  Nursing note and vitals reviewed. Constitutional: He is oriented to person, place, and time. He appears well-developed and well-nourished. He appears distressed.  Neck: Normal range of motion. Neck supple.  Pulmonary/Chest: Effort normal. No respiratory distress.  Musculoskeletal: He exhibits no edema.  Neurological: He is alert and oriented to person, place, and time.  Skin: Skin is warm and dry. Rash noted. No erythema.  Pruritic, scaly rash covering most of the face, neck, anterior-posterior torso. Also involves the upper extremities to a lesser degree.  Psychiatric: He has a normal mood and affect.    ED Course  Procedures (including critical care time) Labs Review Labs Reviewed - No data to display Imaging Review No results found.    MDM   1. Chronic eczema      Triamcinolone cream 85 gm Medrol dose pack See your PCP or derm for refills of meds    Hayden Rasmussenavid Keddrick Wyne, NP 06/29/13 1244

## 2013-06-29 NOTE — Discharge Instructions (Signed)
Eczema Eczema, also called atopic dermatitis, is a skin disorder that causes inflammation of the skin. It causes a red rash and dry, scaly skin. The skin becomes very itchy. Eczema is generally worse during the cooler winter months and often improves with the warmth of summer. Eczema usually starts showing signs in infancy. Some children outgrow eczema, but it may last through adulthood.  CAUSES  The exact cause of eczema is not known, but it appears to run in families. People with eczema often have a family history of eczema, allergies, asthma, or hay fever. Eczema is not contagious. Flare-ups of the condition may be caused by:   Contact with something you are sensitive or allergic to.   Stress. SIGNS AND SYMPTOMS  Dry, scaly skin.   Red, itchy rash.   Itchiness. This may occur before the skin rash and may be very intense.  DIAGNOSIS  The diagnosis of eczema is usually made based on symptoms and medical history. TREATMENT  Eczema cannot be cured, but symptoms usually can be controlled with treatment and other strategies. A treatment plan might include:  Controlling the itching and scratching.   Use over-the-counter antihistamines as directed for itching. This is especially useful at night when the itching tends to be worse.   Use over-the-counter steroid creams as directed for itching.   Avoid scratching. Scratching makes the rash and itching worse. It may also result in a skin infection (impetigo) due to a break in the skin caused by scratching.   Keeping the skin well moisturized with creams every day. This will seal in moisture and help prevent dryness. Lotions that contain alcohol and water should be avoided because they can dry the skin.   Limiting exposure to things that you are sensitive or allergic to (allergens).   Recognizing situations that cause stress.   Developing a plan to manage stress.  HOME CARE INSTRUCTIONS   Only take over-the-counter or  prescription medicines as directed by your health care provider.   Do not use anything on the skin without checking with your health care provider.   Keep baths or showers short (5 minutes) in warm (not hot) water. Use mild cleansers for bathing. These should be unscented. You may add nonperfumed bath oil to the bath water. It is best to avoid soap and bubble bath.   Immediately after a bath or shower, when the skin is still damp, apply a moisturizing ointment to the entire body. This ointment should be a petroleum ointment. This will seal in moisture and help prevent dryness. The thicker the ointment, the better. These should be unscented.   Keep fingernails cut short. Children with eczema may need to wear soft gloves or mittens at night after applying an ointment.   Dress in clothes made of cotton or cotton blends. Dress lightly, because heat increases itching.   A child with eczema should stay away from anyone with fever blisters or cold sores. The virus that causes fever blisters (herpes simplex) can cause a serious skin infection in children with eczema. SEEK MEDICAL CARE IF:   Your itching interferes with sleep.   Your rash gets worse or is not better within 1 week after starting treatment.   You see pus or soft yellow scabs in the rash area.   You have a fever.   You have a rash flare-up after contact with someone who has fever blisters.  Document Released: 05/27/2000 Document Revised: 03/20/2013 Document Reviewed: 12/31/2012 Exit[employer] Patient Information 2014 [employer].  

## 2013-06-29 NOTE — ED Notes (Signed)
Parent concerned about recurrent eczema; cannot get into his MD office for a couple of weeks

## 2013-06-29 NOTE — ED Provider Notes (Signed)
Medical screening examination/treatment/procedure(s) were performed by non-physician practitioner and as supervising physician I was immediately available for consultation/collaboration.  Alain Deschene, M.D.  Barrie Sigmund C Marshel Golubski, MD 06/29/13 2218 

## 2013-09-01 ENCOUNTER — Encounter (HOSPITAL_COMMUNITY): Payer: Self-pay | Admitting: Emergency Medicine

## 2013-09-01 ENCOUNTER — Emergency Department (HOSPITAL_COMMUNITY)
Admission: EM | Admit: 2013-09-01 | Discharge: 2013-09-01 | Disposition: A | Discharge status: Home / Self Care | Payer: Medicaid Other | Source: Home / Self Care | Attending: Family Medicine | Admitting: Family Medicine

## 2013-09-01 DIAGNOSIS — L259 Unspecified contact dermatitis, unspecified cause: Secondary | ICD-10-CM

## 2013-09-01 DIAGNOSIS — L309 Dermatitis, unspecified: Secondary | ICD-10-CM

## 2013-09-01 MED ORDER — HYDROXYZINE HCL 25 MG PO TABS: 25.0000 mg | ORAL_TABLET | Freq: Four times a day (QID) | ORAL | Status: AC | PRN

## 2013-09-01 MED ORDER — TRIAMCINOLONE ACETONIDE 0.1 % EX CREA: 1.0000 "application " | TOPICAL_CREAM | Freq: Two times a day (BID) | CUTANEOUS | Status: AC

## 2013-09-01 MED ORDER — KETOCONAZOLE 2 % EX CREA
1.0000 | TOPICAL_CREAM | Freq: Every day | CUTANEOUS | Status: AC
Start: 2013-09-01 — End: ?

## 2013-09-01 MED ORDER — PREDNISONE 10 MG PO KIT: PACK | ORAL | Status: AC

## 2013-09-01 NOTE — ED Provider Notes (Signed)
CSN: [id number]     Arrival date & time 09/01/13  1526 History   First MD Initiated Contact with Patient 09/01/13 1709     Chief Complaint  Patient presents with  . Eczema   (Consider location/radiation/quality/duration/timing/severity/associated sxs/prior Treatment) HPI Comments: 23 year old male presents complaining of eczema. He has had eczema for about 20 years. He usually uses triamcinolone cream on daily which helps, but he ran out. He was previously under the care of a dermatologist but will not be going back to see them, he is in the process of setting up a new dermatologist. He also uses ketoconazole cream on his head. He is here today because she would like refills of these. His current eczema is no different than it ever has been, although that is bothering him a lot right now because he has been off of the triamcinolone cream for a month. He has this on his face, trunk, and extremities. No other complaints   Past Medical History  Diagnosis Date  . LEARNING DISABILITY 05/11/2010  . ECZEMA, ATOPIC 06/26/2009  . ASTHMA 03/27/2009  . Allergic rhinitis, cause unspecified 09/30/2009   History reviewed. No pertinent past surgical history. Family History  Problem Relation Age of Onset  . Eczema Father   . Diabetes Father   . Thyroid disease Father   . Emphysema Other     Grandmother   History  Substance Use Topics  . Smoking status: Never Smoker   . Smokeless tobacco: Not on file  . Alcohol Use: Not on file    Review of Systems  Constitutional: Negative for fever, chills and fatigue.  HENT: Negative for sore throat.   Eyes: Negative for visual disturbance.  Respiratory: Negative for cough and shortness of breath.   Cardiovascular: Negative for chest pain, palpitations and leg swelling.  Gastrointestinal: Negative for nausea, vomiting, abdominal pain, diarrhea and constipation.  Genitourinary: Negative for dysuria, urgency, frequency and hematuria.  Musculoskeletal:  Negative for arthralgias, myalgias, neck pain and neck stiffness.  Skin: Positive for rash (see history of present illness).  Neurological: Negative for dizziness, weakness and light-headedness.    Allergies  Review of patient's allergies indicates no known allergies.  Home Medications   Current Outpatient Rx  Name  Route  Sig  Dispense  Refill  . desoximetasone (TOPICORT) 0.25 % cream   Topical   Apply topically 2 (two) times daily.           . fexofenadine (ALLEGRA) 180 MG tablet   Oral   Take 180 mg by mouth daily.         . hydrOXYzine (ATARAX) 25 MG tablet   Oral   Take 25 mg by mouth every 6 (six) hours as needed. For itch          . hydrOXYzine (ATARAX/VISTARIL) 25 MG tablet   Oral   Take 1 tablet (25 mg total) by mouth every 6 (six) hours as needed for itching.   60 tablet   1   . ketoconazole (NIZORAL) 2 % cream   Topical   Apply 1 application topically daily.   60 g   1   . loratadine (CLARITIN) 10 MG tablet      take 1 tablet by mouth once daily   30 tablet   0     OV required for further refills   . methylPREDNISolone (MEDROL DOSEPAK) 4 MG tablet      follow package directions   21 tablet   0   . PredniSONE  10 MG KIT      12 day taper dose pack. Use as directed   48 each   0   . triamcinolone cream (KENALOG) 0.1 %   Topical   Apply 1 application topically 2 (two) times daily. May use on face   85 g   0   . triamcinolone cream (KENALOG) 0.1 %   Topical   Apply 1 application topically 2 (two) times daily.   454 g   0    BP 109/75  Pulse 86  Temp(Src) 97.7 F (36.5 C) (Oral)  Resp 16  SpO2 100% Physical Exam  Nursing note and vitals reviewed. Constitutional: He is oriented to person, place, and time. He appears well-developed and well-nourished. No distress.  HENT:  Head: Normocephalic.  Pulmonary/Chest: Effort normal. No respiratory distress.  Neurological: He is alert and oriented to person, place, and time.  Coordination normal.  Skin: Skin is warm and dry. Rash (dry skin consistent with eczema on all flexor surfaces of joints, trunk, neck, arms, legs.  ) noted. He is not diaphoretic.  Psychiatric: He has a normal mood and affect. Judgment normal.    ED Course  Procedures (including critical care time) Labs Review Labs Reviewed - No data to display Imaging Review No results found.   MDM   1. Eczema    Will use a short course of oral steroids and will refill his kenalog and nizoral cream.  He is requesting 1 lb tub.  F/u with PCP or dermatologist    Meds ordered this encounter  Medications  . fexofenadine (ALLEGRA) 180 MG tablet    Sig: Take 180 mg by mouth daily.  Marland Kitchen triamcinolone cream (KENALOG) 0.1 %    Sig: Apply 1 application topically 2 (two) times daily.    Dispense:  454 g    Refill:  0    Order Specific Question:  Supervising Provider    Answer:  Billy Fischer [phone]  . ketoconazole (NIZORAL) 2 % cream    Sig: Apply 1 application topically daily.    Dispense:  60 g    Refill:  1    Order Specific Question:  Supervising Provider    Answer:  Billy Fischer [phone]  . hydrOXYzine (ATARAX/VISTARIL) 25 MG tablet    Sig: Take 1 tablet (25 mg total) by mouth every 6 (six) hours as needed for itching.    Dispense:  60 tablet    Refill:  1    Order Specific Question:  Supervising Provider    Answer:  Billy Fischer [phone]  . PredniSONE 10 MG KIT    Sig: 12 day taper dose pack. Use as directed    Dispense:  48 each    Refill:  0    Order Specific Question:  Supervising Provider    Answer:  Ihor Gully D [place], PA-C 09/01/13 1721

## 2013-09-01 NOTE — ED Provider Notes (Signed)
Medical screening examination/treatment/procedure(s) were performed by resident physician or non-physician practitioner and as supervising physician I was immediately available for consultation/collaboration.   Barkley BrunsKINDL,Aliceson Dolbow DOUGLAS MD.   Linna HoffJames D Xena Propst, MD 09/01/13 [phone]

## 2013-09-01 NOTE — Discharge Instructions (Signed)
Continue daily allegra.  Add daily 150 mg ranitidine (Zantac) as well to help with itching.    Eczema Eczema, also called atopic dermatitis, is a skin disorder that causes inflammation of the skin. It causes a red rash and dry, scaly skin. The skin becomes very itchy. Eczema is generally worse during the cooler winter months and often improves with the warmth of summer. Eczema usually starts showing signs in infancy. Some children outgrow eczema, but it may last through adulthood.  CAUSES  The exact cause of eczema is not known, but it appears to run in families. People with eczema often have a family history of eczema, allergies, asthma, or hay fever. Eczema is not contagious. Flare-ups of the condition may be caused by:   Contact with something you are sensitive or allergic to.   Stress. SIGNS AND SYMPTOMS  Dry, scaly skin.   Red, itchy rash.   Itchiness. This may occur before the skin rash and may be very intense.  DIAGNOSIS  The diagnosis of eczema is usually made based on symptoms and medical history. TREATMENT  Eczema cannot be cured, but symptoms usually can be controlled with treatment and other strategies. A treatment plan might include:  Controlling the itching and scratching.   Use over-the-counter antihistamines as directed for itching. This is especially useful at night when the itching tends to be worse.   Use over-the-counter steroid creams as directed for itching.   Avoid scratching. Scratching makes the rash and itching worse. It may also result in a skin infection (impetigo) due to a break in the skin caused by scratching.   Keeping the skin well moisturized with creams every day. This will seal in moisture and help prevent dryness. Lotions that contain alcohol and water should be avoided because they can dry the skin.   Limiting exposure to things that you are sensitive or allergic to (allergens).   Recognizing situations that cause stress.    Developing a plan to manage stress.  HOME CARE INSTRUCTIONS   Only take over-the-counter or prescription medicines as directed by your health care provider.   Do not use anything on the skin without checking with your health care provider.   Keep baths or showers short (5 minutes) in warm (not hot) water. Use mild cleansers for bathing. These should be unscented. You may add nonperfumed bath oil to the bath water. It is best to avoid soap and bubble bath.   Immediately after a bath or shower, when the skin is still damp, apply a moisturizing ointment to the entire body. This ointment should be a petroleum ointment. This will seal in moisture and help prevent dryness. The thicker the ointment, the better. These should be unscented.   Keep fingernails cut short. Children with eczema may need to wear soft gloves or mittens at night after applying an ointment.   Dress in clothes made of cotton or cotton blends. Dress lightly, because heat increases itching.   A child with eczema should stay away from anyone with fever blisters or cold sores. The virus that causes fever blisters (herpes simplex) can cause a serious skin infection in children with eczema. SEEK MEDICAL CARE IF:   Your itching interferes with sleep.   Your rash gets worse or is not better within 1 week after starting treatment.   You see pus or soft yellow scabs in the rash area.   You have a fever.   You have a rash flare-up after contact with someone who has fever  blisters.  Document Released: 05/27/2000 Document Revised: 03/20/2013 Document Reviewed: 12/31/2012 [facility] Patient Information 2014 [place]Care, [place].

## 2013-09-01 NOTE — ED Notes (Signed)
Eczema all over his body- worse on face and head.  Has not been back to the dermatologist, because Mom did not agree with him.  Dad said he needs a 1 lb. Jar of Triamcinolone and bottle of Ketoconazole

## 2018-08-07 ENCOUNTER — Emergency Department (HOSPITAL_COMMUNITY)
Admission: EM | Admit: 2018-08-07 | Discharge: 2018-08-07 | Disposition: A | Discharge status: Home / Self Care | Payer: Medicaid Other | Attending: Emergency Medicine | Admitting: Emergency Medicine

## 2018-08-07 ENCOUNTER — Other Ambulatory Visit: Payer: Self-pay

## 2018-08-07 ENCOUNTER — Encounter (HOSPITAL_COMMUNITY): Payer: Self-pay

## 2018-08-07 ENCOUNTER — Emergency Department (HOSPITAL_COMMUNITY): Payer: Medicaid Other

## 2018-08-07 DIAGNOSIS — Y929 Unspecified place or not applicable: Secondary | ICD-10-CM | POA: Insufficient documentation

## 2018-08-07 DIAGNOSIS — S71102A Unspecified open wound, left thigh, initial encounter: Secondary | ICD-10-CM | POA: Diagnosis not present

## 2018-08-07 DIAGNOSIS — Z2914 Encounter for prophylactic rabies immune globin: Secondary | ICD-10-CM | POA: Diagnosis not present

## 2018-08-07 DIAGNOSIS — Y9301 Activity, walking, marching and hiking: Secondary | ICD-10-CM | POA: Insufficient documentation

## 2018-08-07 DIAGNOSIS — Y999 Unspecified external cause status: Secondary | ICD-10-CM | POA: Insufficient documentation

## 2018-08-07 DIAGNOSIS — Z203 Contact with and (suspected) exposure to rabies: Secondary | ICD-10-CM | POA: Insufficient documentation

## 2018-08-07 DIAGNOSIS — Z23 Encounter for immunization: Secondary | ICD-10-CM | POA: Diagnosis not present

## 2018-08-07 DIAGNOSIS — W540XXA Bitten by dog, initial encounter: Secondary | ICD-10-CM | POA: Diagnosis not present

## 2018-08-07 MED ORDER — RABIES IMMUNE GLOBULIN 150 UNIT/ML IM INJ
20.0000 [IU]/kg | INJECTION | Freq: Once | INTRAMUSCULAR | Status: AC
  Administered 2018-08-07: 1125 [IU] via INTRAMUSCULAR
  Filled 2018-08-07: qty 8

## 2018-08-07 MED ORDER — LIDOCAINE-EPINEPHRINE-TETRACAINE (LET) SOLUTION
3.0000 mL | Freq: Once | NASAL | Status: AC
  Administered 2018-08-07: 3 mL via TOPICAL
  Filled 2018-08-07: qty 3

## 2018-08-07 MED ORDER — AMOXICILLIN-POT CLAVULANATE 875-125 MG PO TABS: 1.0000 | ORAL_TABLET | Freq: Two times a day (BID) | ORAL | 0 refills | Status: AC

## 2018-08-07 MED ORDER — AMOXICILLIN-POT CLAVULANATE 875-125 MG PO TABS: 1.0000 | ORAL_TABLET | Freq: Two times a day (BID) | ORAL | 0 refills | Status: DC

## 2018-08-07 MED ORDER — AMOXICILLIN-POT CLAVULANATE 875-125 MG PO TABS
1.0000 | ORAL_TABLET | Freq: Once | ORAL | Status: AC
  Administered 2018-08-07: 1 via ORAL
  Filled 2018-08-07: qty 1

## 2018-08-07 MED ORDER — RABIES VACCINE, PCEC IM SUSR
1.0000 mL | Freq: Once | INTRAMUSCULAR | Status: AC
  Administered 2018-08-07: 1 mL via INTRAMUSCULAR
  Filled 2018-08-07: qty 1

## 2018-08-07 MED ORDER — TETANUS-DIPHTH-ACELL PERTUSSIS 5-2.5-18.5 LF-MCG/0.5 IM SUSP
0.5000 mL | Freq: Once | INTRAMUSCULAR | Status: AC
  Administered 2018-08-07: 0.5 mL via INTRAMUSCULAR
  Filled 2018-08-07: qty 0.5

## 2018-08-07 NOTE — Discharge Instructions (Addendum)
You have been diagnosed today with Dog Bite.  At this time there does not appear to be the presence of an emergent medical condition, however there is always the potential for conditions to change. Please read and follow the below instructions.  Please return to the Emergency Department immediately for any new or worsening symptoms. Please be sure to follow up with your Primary Care Provider within one week regarding your visit today; please call their office to schedule an appointment even if you are feeling better for a follow-up visit. Please take the antibiotic Augmentin as prescribed twice daily for the next week. Your dog bite was not stitched together on purpose today.  Closing dog bite wounds leads to a higher risk of infection.  It is very important for you to keep the area clean and change the dressing twice daily until it heals.  You can rinse the area gently with clean water and soap once daily.  Please have your wound checked by your primary care provider in 3 days to ensure proper healing. You have been given your first dose of rabies vaccine today.  Please reference the letter given to you for your rabies vaccine.  You will need repeat vaccinations on February 28, March 3 and March 10.  These may be administered at our urgent care or if necessary here at the emergency department.  Get help right away if: You have a red streak that leads away from your wound. You have non-clear fluid or more blood coming from your wound. There is pus or a bad smell coming from your wound. You have trouble moving your injured area. You have numbness or tingling that extends beyond the wound. You have more redness, swelling, or pain around your wound. Your wound feels warm to the touch. You have a fever or chills. You have a general feeling of sickness (malaise). You feel nauseous or you vomit. You have pain that does not get better.  Please read the additional information packets attached to your  discharge summary.  Do not take your medicine if  develop an itchy rash, swelling in your mouth or lips, or difficulty breathing.

## 2018-08-07 NOTE — ED Provider Notes (Signed)
[facility] EMERGENCY DEPARTMENT Provider Note   CSN: [id number] Arrival date & time: 08/07/18  1638    History   Chief Complaint Chief Complaint  Patient presents with  . Animal Bite    HPI Roger Henderson is a 28 y.o. male presented today with his mother following dog bite.  Approximately 6 PM today patient was walking around his neighborhood when he was attacked by a dog.  Patient patient states that the dog latched onto his left lateral thigh and then when the patient pulled away and then the dog ran off.  Patient states it is a neighbor's dog who is moving today and the dog's vaccination status is unknown.  Dog cannot be found today.  Patient with bite wound to the left lateral thigh, no active bleeding pressure applied by patient prior to arrival.  The patient and his mother he is an otherwise healthy 28 year old male without history of chronic medical conditions aside from eczema.  They are unsure of patient's last tetanus shot and would like update today.  They are also requesting rabies vaccination.  Patient denies any other injury from this encounter.  Denies head injury, headache, neck pain or back pain.  He denies fall or loss of consciousness.  He denies chest or abdominal pain.  Denies numbness weakness or tingling of the extremities.  Animal control already involved per mother.    HPI  Past Medical History:  Diagnosis Date  . Allergic rhinitis, cause unspecified 09/30/2009  . ASTHMA 03/27/2009  . ECZEMA, ATOPIC 06/26/2009  . LEARNING DISABILITY 05/11/2010    Patient Active Problem List   Diagnosis Date Noted  . LEARNING DISABILITY 05/11/2010  . URTICARIA 05/11/2010  . ANGIOEDEMA 05/11/2010  . SINUSITIS- ACUTE-NOS 03/01/2010  . FATIGUE 10/20/2009  . Allergic rhinitis, cause unspecified 09/30/2009  . ECZEMA, ATOPIC 06/26/2009  . ASTHMA 03/27/2009    No past surgical history on file.      Home Medications    Prior to Admission  medications   Medication Sig Start Date End Date Taking? Authorizing Provider  amoxicillin-clavulanate (AUGMENTIN) 875-125 MG tablet Take 1 tablet by mouth every 12 (twelve) hours for 10 days. 08/07/18 08/17/18  Nuala Alpha A, PA-C  desoximetasone (TOPICORT) 0.25 % cream Apply topically 2 (two) times daily.      [provider]  fexofenadine (ALLEGRA) 180 MG tablet Take 180 mg by mouth daily.    [provider]  hydrOXYzine (ATARAX) 25 MG tablet Take 25 mg by mouth every 6 (six) hours as needed. For itch     [provider]  hydrOXYzine (ATARAX/VISTARIL) 25 MG tablet Take 1 tablet (25 mg total) by mouth every 6 (six) hours as needed for itching. 09/01/13   Luana Shu, Freeman Caldron, PA-C  ketoconazole (NIZORAL) 2 % cream Apply 1 application topically daily. 09/01/13   Liam Graham, PA-C  loratadine (CLARITIN) 10 MG tablet take 1 tablet by mouth once daily 10/26/11   Janith Lima, MD  methylPREDNISolone (MEDROL DOSEPAK) 4 MG tablet follow package directions 06/29/13   Janne Napoleon, NP  PredniSONE 10 MG KIT 12 day taper dose pack. Use as directed 09/01/13   Allena Katz H, PA-C  triamcinolone cream (KENALOG) 0.1 % Apply 1 application topically 2 (two) times daily. May use on face 06/29/13   Janne Napoleon, NP  triamcinolone cream (KENALOG) 0.1 % Apply 1 application topically 2 (two) times daily. 09/01/13   Liam Graham, PA-C    Family History Family History  Problem Relation Age of Onset  . Eczema Father   . Diabetes Father   . Thyroid disease Father   . Emphysema Other        Grandmother    Social History Social History   Tobacco Use  . Smoking status: Never Smoker  Substance Use Topics  . Alcohol use: Not on file  . Drug use: No     Allergies   Patient has no known allergies.   Review of Systems Review of Systems  Constitutional: Negative.  Negative for chills and fever.  Cardiovascular: Negative.  Negative for chest pain.  Gastrointestinal: Negative.   Negative for abdominal pain, diarrhea, nausea and vomiting.  Musculoskeletal: Negative.  Negative for arthralgias, back pain, myalgias and neck pain.  Skin: Positive for wound.  Neurological: Negative.  Negative for weakness, numbness and headaches.  All other systems reviewed and are negative.  Physical Exam Updated Vital Signs BP (!) 132/95 (BP Location: Right Arm)   Pulse 100   Temp 97.9 F (36.6 C) (Oral)   Resp 16   Wt 56.7 kg   SpO2 99%   BMI 17.43 kg/m   Physical Exam Constitutional:      General: He is not in acute distress.    Appearance: Normal appearance. He is not ill-appearing or diaphoretic.  HENT:     Head: Normocephalic and atraumatic. No raccoon eyes, Battle's sign, abrasion or contusion.     Jaw: There is normal jaw occlusion. No trismus.     Right Ear: Tympanic membrane, ear canal and external ear normal. No hemotympanum.     Left Ear: Tympanic membrane, ear canal and external ear normal. No hemotympanum.     Ears:     Comments: Hearing grossly intact bilaterally    Nose: Nose normal. No nasal tenderness or rhinorrhea.     Right Nostril: No epistaxis.     Left Nostril: No epistaxis.     Mouth/Throat:     Lips: Pink.     Mouth: Mucous membranes are moist.     Pharynx: Oropharynx is clear. Uvula midline.     Comments: No signs of injury to the mouth Eyes:     General: Vision grossly intact. Gaze aligned appropriately.     Extraocular Movements: Extraocular movements intact.     Conjunctiva/sclera: Conjunctivae normal.     Pupils: Pupils are equal, round, and reactive to light.     Comments: Visual fields grossly intact bilaterally  Neck:     Musculoskeletal: Full passive range of motion without pain, normal range of motion and neck supple. No neck rigidity or spinous process tenderness.     Trachea: Trachea and phonation normal. No tracheal tenderness or tracheal deviation.  Cardiovascular:     Rate and Rhythm: Normal rate and regular rhythm.      Pulses:          Dorsalis pedis pulses are 2+ on the right side and 2+ on the left side.       Posterior tibial pulses are 2+ on the right side and 2+ on the left side.     Heart sounds: Normal heart sounds.  Pulmonary:     Effort: Pulmonary effort is normal. No respiratory distress.     Breath sounds: Normal breath sounds and air entry. No decreased breath sounds.  Chest:     Comments: No sign of injury to the chest Abdominal:     General: Bowel sounds are normal. There is no distension.     Palpations:  Abdomen is soft.     Tenderness: There is no abdominal tenderness. There is no guarding or rebound.     Comments: No sign of injury to the abdomen  Musculoskeletal:     Right knee: Normal.     Left knee: Normal.     Right ankle: Normal.     Left ankle: Normal.       Legs:     Right foot: Normal.     Left foot: Normal.     Comments: No midline C/T/L spinal tenderness to palpation, no deformity, crepitus, or step-off noted. No sign of injury to the neck or back.  Hips stable to compression bilaterally. Patient able to actively bring knees towards chest bilaterally without pain.  All major joints brought through range of motion without crepitus or deformity. - Lacerations/bite wound present to left lateral thigh please see picture below. - No other extremity wounds found today.  Feet:     Right foot:     Protective Sensation: 3 sites tested. 3 sites sensed.     Left foot:     Protective Sensation: 3 sites tested. 3 sites sensed.  Skin:    General: Skin is warm and dry.     Capillary Refill: Capillary refill takes less than 2 seconds.     Comments: Patient with extensive eczema  Neurological:     Mental Status: He is alert and oriented to person, place, and time.     GCS: GCS eye subscore is 4. GCS verbal subscore is 5. GCS motor subscore is 6.     Comments: Mental Status: Alert, oriented, thought content appropriate, able to give a coherent history. Speech fluent without  evidence of aphasia. Able to follow 2 step commands without difficulty. Cranial Nerves: II: Peripheral visual fields grossly normal, pupils equal, round, reactive to light III,IV, VI: ptosis not present, extra-ocular motions intact bilaterally V,VII: smile symmetric, eyebrows raise symmetric, facial light touch sensation equal VIII: hearing grossly normal to voice X: uvula elevates symmetrically XI: bilateral shoulder shrug symmetric and strong XII: midline tongue extension without fassiculations Motor: Normal tone. 5/5 strength in upper and lower extremities bilaterally including strong and equal grip strength and dorsiflexion/plantar flexion Sensory: Sensation intact to light touch in all extremities.Negative Romberg.  Cerebellar: normal finger-to-nose maze with bilateral upper extremities. Normal heel-to -shin balance bilaterally of the lower extremity. No pronator drift.  Gait: normal gait and balance CV: distal pulses palpable throughout  Psychiatric:        Behavior: Behavior is cooperative.         ED Treatments / Results  Labs (all labs ordered are listed, but only abnormal results are displayed) Labs Reviewed - No data to display  EKG None  Radiology Dg Femur Min 2 Views Left  Result Date: 08/07/2018 CLINICAL DATA:  Bit by dog. EXAM: LEFT FEMUR 2 VIEWS COMPARISON:  None. FINDINGS: There is no evidence of fracture or other focal bone lesions. Soft tissues are unremarkable. IMPRESSION: Negative. Electronically Signed   By: Rolm Baptise M.D.   On: 08/07/2018 19:48    Procedures Irrigation and debridement Date/Time: 08/07/2018 9:05 PM Performed by: Deliah Boston, PA-C Authorized by: Deliah Boston, PA-C  Consent: Verbal consent obtained. Risks and benefits: risks, benefits and alternatives were discussed Consent given by: patient and parent Patient understanding: patient states understanding of the procedure being performed Patient consent: the  patient's understanding of the procedure matches consent given Test results: test results available and properly labeled Imaging studies:  imaging studies available Required items: required blood products, implants, devices, and special equipment available Patient identity confirmed: verbally with patient and arm band Preparation: Patient was prepped and draped in the usual sterile fashion. Local anesthesia used: yes  Anesthesia: Local anesthesia used: yes Local Anesthetic: LET (lido,epi,tetracaine) Patient tolerance: Patient tolerated the procedure well with no immediate complications Comments: Wound explored to full depth and range of motion without visualization of major muscular, vascular, nervous, fascial, tendon, ligamentous or other significant structure involvement.  Some subcutaneous fat present within wound. Extensively irrigated with 2 Liters of NS in addition to cleansing spray and manual debridement.     (including critical care time)  Medications Ordered in ED Medications  lidocaine-EPINEPHrine-tetracaine (LET) solution (has no administration in time range)  rabies immune globulin (HYPERAB/KEDRAB) injection 1,125 Units (has no administration in time range)  amoxicillin-clavulanate (AUGMENTIN) 875-125 MG per tablet 1 tablet (has no administration in time range)  Tdap (BOOSTRIX) injection 0.5 mL (0.5 mLs Intramuscular Given 08/07/18 1949)  rabies vaccine (RABAVERT) injection 1 mL (1 mL Intramuscular Given 08/07/18 2110)     Initial Impression / Assessment and Plan / ED Course  I have reviewed the triage vital signs and the nursing notes.  Pertinent labs & imaging results that were available during my care of the patient were reviewed by me and considered in my medical decision making (see chart for details).    28 year old male presenting today with his mother for dog bite of the left thigh.  No other signs of injury today.  Patient without any other complaint.  Extremity  neurovascularly intact with full range of motion 5/5 strength.  DG left femur: Negative  Wound thoroughly cleaned in ED today. Wound explored and bottom of wound seen in a bloodless field.  Based on mechanism of wound and location it is inadvisable to repair this today due to risk of infection.  Extensive conversation was held with patient and his mother and they state understanding that wound will be left to heal by secondary intent.  They state understanding that patient will have a scar in this area and they are not concerned because it is on his leg.  I have informed them that they must change the dressing twice daily and monitor daily for signs of infection.  Rinse gently with soap and water.  Patient was given clean sterile dressing here today and bacitracin ointment was applied by nursing staff.  Tdap updated today First dose of Augmentin given in emergency department, prescription given twice daily x10 days First round of rabies vaccine administered, rabies letter given advising patient and his mother on follow-up vaccine schedule. - Patient and mother counseled on home wound care. Patient was urged to return to the Emergency Department for worsening pain, swelling, expanding erythema especially if it streaks away from the affected area, fever, or for any additional concerns.  At this time there does not appear to be any evidence of an acute emergency medical condition and the patient appears stable for discharge with appropriate outpatient follow up. Diagnosis was discussed with patient and mother who verbalize understanding of care plan and is agreeable to discharge. I have discussed return precautions with patient and Mother who verbalize understanding of return precautions. Patient strongly encouraged to follow-up with their PCP as well as adherence to rabies vaccine schedule. All questions answered.  Patient's case discussed with Dr. Tyrone Nine who agrees with plan of care and to discharge.    Note: Portions of this report may have been  transcribed using voice recognition software. Every effort was made to ensure accuracy; however, inadvertent computerized transcription errors may still be present. Final Clinical Impressions(s) / ED Diagnoses   Final diagnoses:  Dog bite, initial encounter    ED Discharge Orders         Ordered    amoxicillin-clavulanate (AUGMENTIN) 875-125 MG tablet  Every 12 hours     08/07/18 2122           Gari Crown 08/07/18 2132    Deno Etienne, DO 08/07/18 2231

## 2018-08-07 NOTE — ED Triage Notes (Signed)
Pt BIB [facility]. Pt was [place] the [place] when he was bit by his neighbors dog. EMS reports that animal control was called and will be in contact with the patient tomorrow. Per EMS there were no vaccination records on the dog. Pt reports 10/10 pain in his left thigh where the bite occurred.

## 2018-08-07 NOTE — ED Notes (Signed)
Patient transported to X-ray 

## 2018-08-10 ENCOUNTER — Ambulatory Visit (HOSPITAL_COMMUNITY)
Admission: EM | Admit: 2018-08-10 | Discharge: 2018-08-10 | Disposition: A | Discharge status: Home / Self Care | Payer: Medicaid Other | Attending: Family Medicine | Admitting: Family Medicine

## 2018-08-10 DIAGNOSIS — Z203 Contact with and (suspected) exposure to rabies: Secondary | ICD-10-CM | POA: Diagnosis not present

## 2018-08-10 DIAGNOSIS — Z23 Encounter for immunization: Secondary | ICD-10-CM

## 2018-08-10 MED ORDER — RABIES VACCINE, PCEC IM SUSR
1.0000 mL | Freq: Once | INTRAMUSCULAR | Status: AC
  Administered 2018-08-10: 1 mL via INTRAMUSCULAR

## 2018-08-10 MED ORDER — RABIES VACCINE, PCEC IM SUSR
INTRAMUSCULAR | Status: AC
  Filled 2018-08-10: qty 1

## 2018-08-10 NOTE — ED Triage Notes (Signed)
Pt presents for 2nd rabies injection. 

## 2018-08-17 ENCOUNTER — Ambulatory Visit (HOSPITAL_COMMUNITY)
Admission: EM | Admit: 2018-08-17 | Discharge: 2018-08-17 | Disposition: A | Discharge status: Home / Self Care | Payer: Medicaid Other | Attending: Family Medicine | Admitting: Family Medicine

## 2018-08-17 DIAGNOSIS — Z203 Contact with and (suspected) exposure to rabies: Secondary | ICD-10-CM

## 2018-08-17 DIAGNOSIS — Z23 Encounter for immunization: Secondary | ICD-10-CM

## 2018-08-17 MED ORDER — RABIES VACCINE, PCEC IM SUSR
1.0000 mL | Freq: Once | INTRAMUSCULAR | Status: AC
  Administered 2018-08-17: 1 mL via INTRAMUSCULAR

## 2018-08-17 MED ORDER — RABIES VACCINE, PCEC IM SUSR
INTRAMUSCULAR | Status: AC
  Filled 2018-08-17: qty 1

## 2018-08-17 NOTE — ED Triage Notes (Signed)
Pt here for rabies shot.  

## 2018-08-22 ENCOUNTER — Ambulatory Visit (HOSPITAL_COMMUNITY)
Admission: EM | Admit: 2018-08-22 | Discharge: 2018-08-22 | Disposition: A | Discharge status: Home / Self Care | Payer: Medicaid Other | Attending: Family Medicine | Admitting: Family Medicine

## 2018-08-22 DIAGNOSIS — Z203 Contact with and (suspected) exposure to rabies: Secondary | ICD-10-CM

## 2018-08-22 DIAGNOSIS — Z23 Encounter for immunization: Secondary | ICD-10-CM | POA: Diagnosis not present

## 2018-08-22 MED ORDER — RABIES VACCINE, PCEC IM SUSR
INTRAMUSCULAR | Status: AC
  Filled 2018-08-22: qty 1

## 2018-08-22 MED ORDER — RABIES VACCINE, PCEC IM SUSR
1.0000 mL | Freq: Once | INTRAMUSCULAR | Status: AC
  Administered 2018-08-22: 1 mL via INTRAMUSCULAR

## 2018-08-22 NOTE — ED Notes (Signed)
Pt here for final rabies vaccine  

## 2019-08-11 ENCOUNTER — Ambulatory Visit: Payer: Medicaid Other

## 2019-08-24 ENCOUNTER — Ambulatory Visit: Payer: Medicaid Other

## 2020-05-23 IMAGING — DX DG FEMUR 2+V*L*
4 series · 4 of 4 positions shown · non-contrast
Comparison: None.

CLINICAL DATA: Bit by dog.

EXAM:
LEFT FEMUR 2 VIEWS

[t femur proximal ap left]
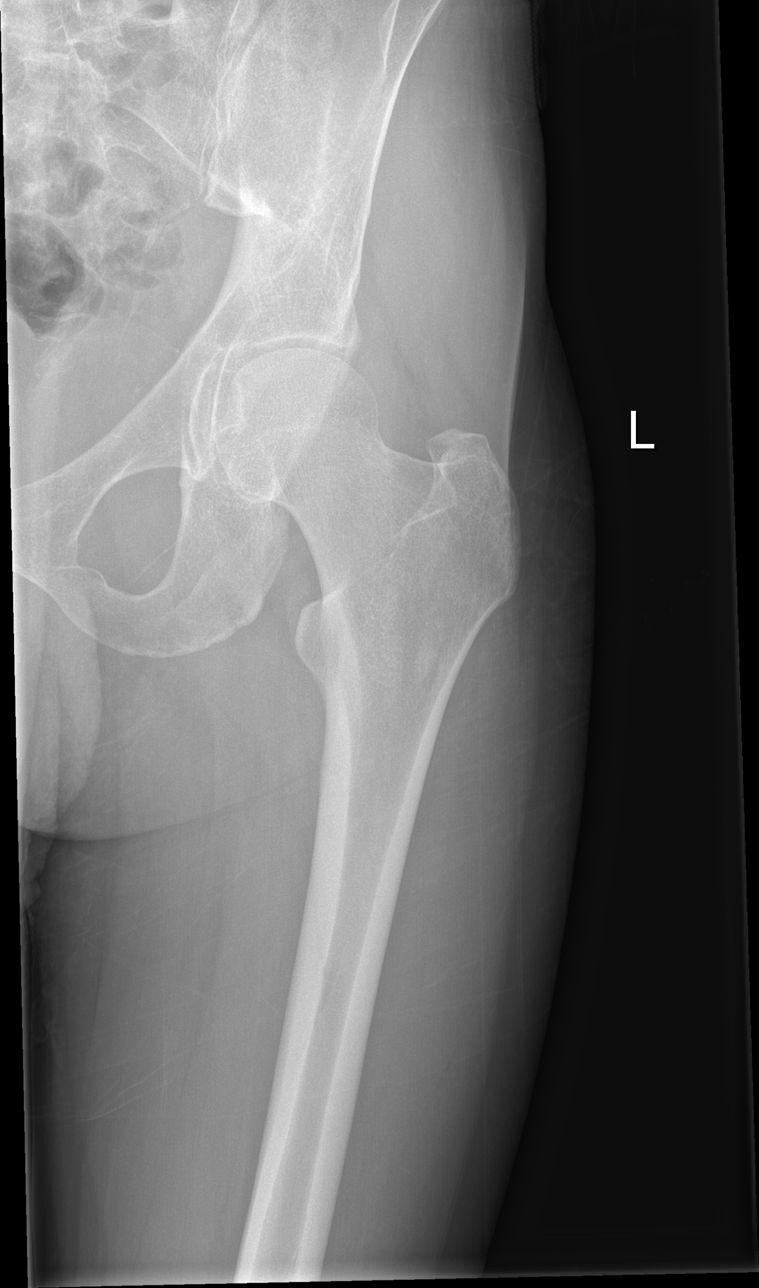

[t femur distal ap left]
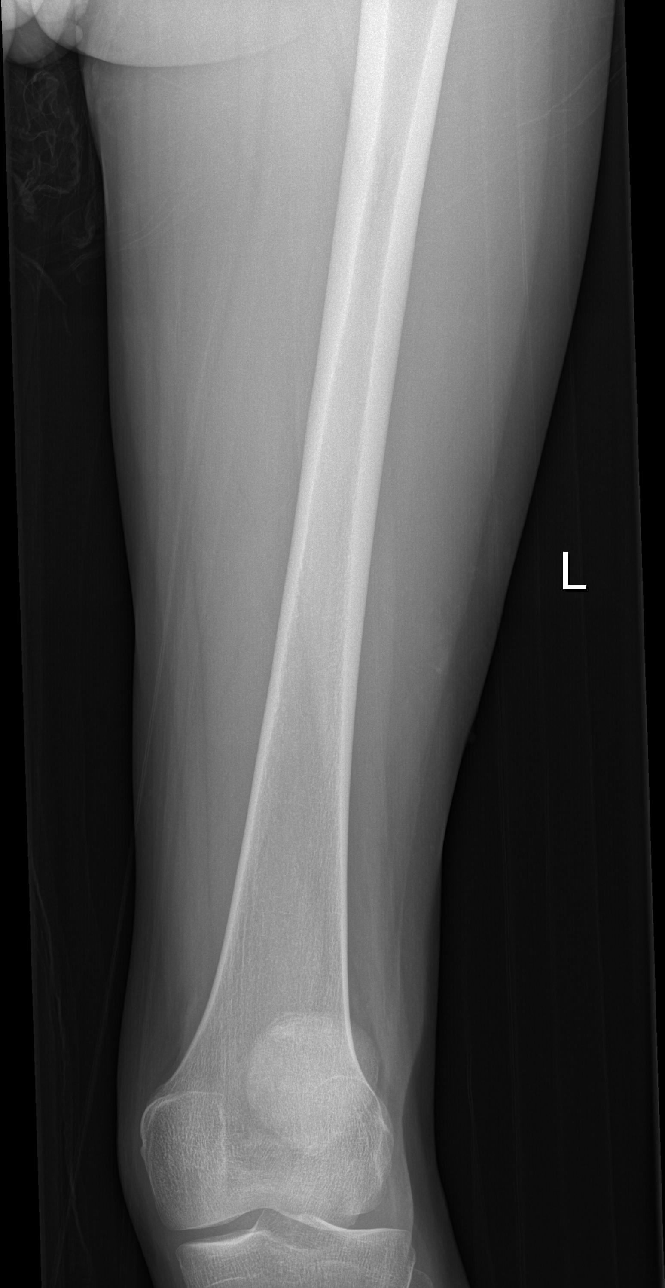

[t femur distal lat left]
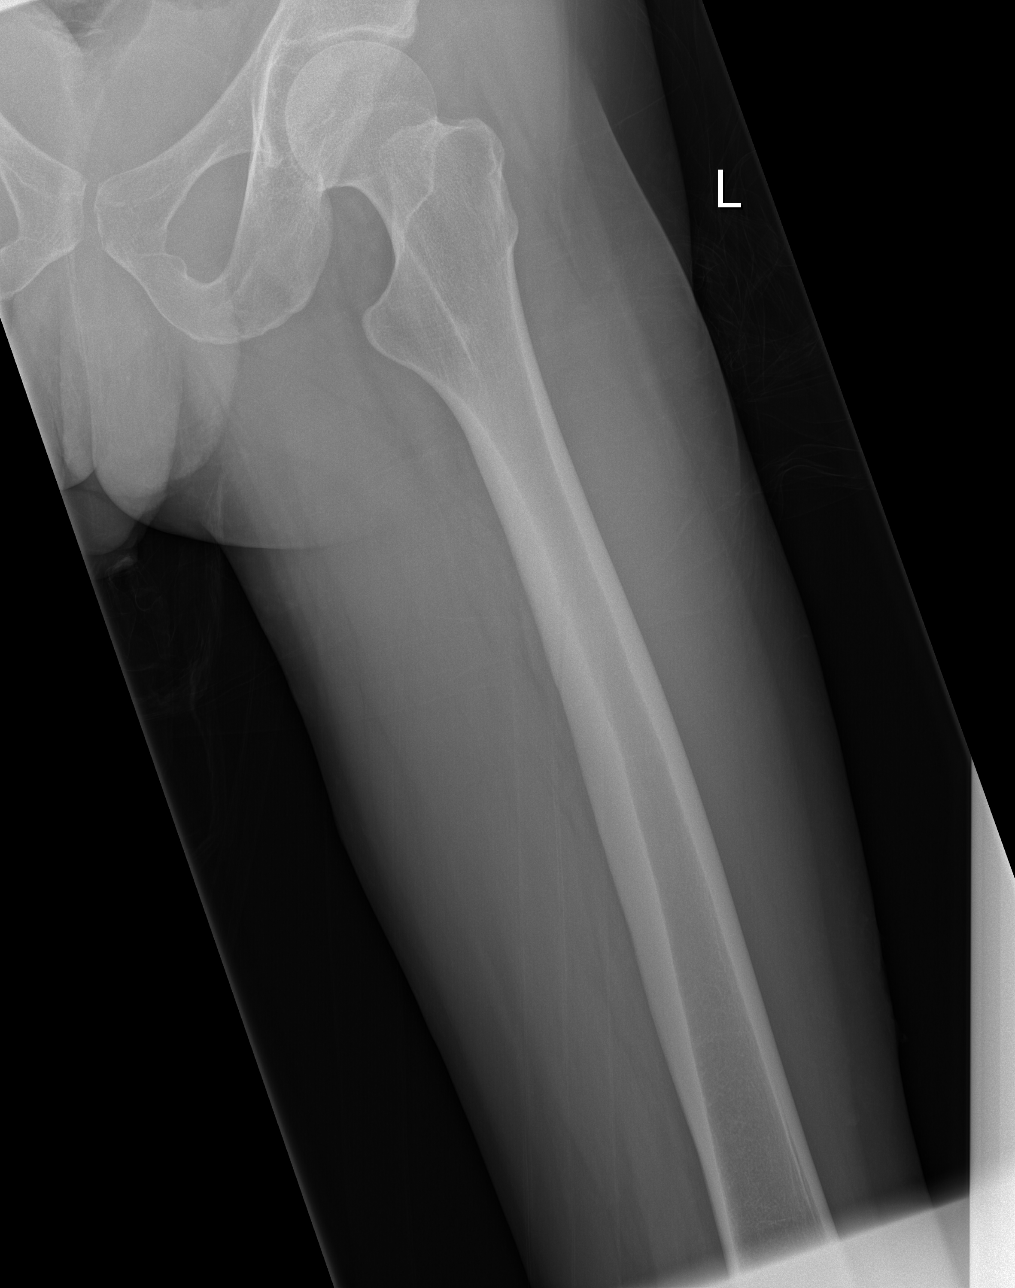

[t femur proximal lat left]
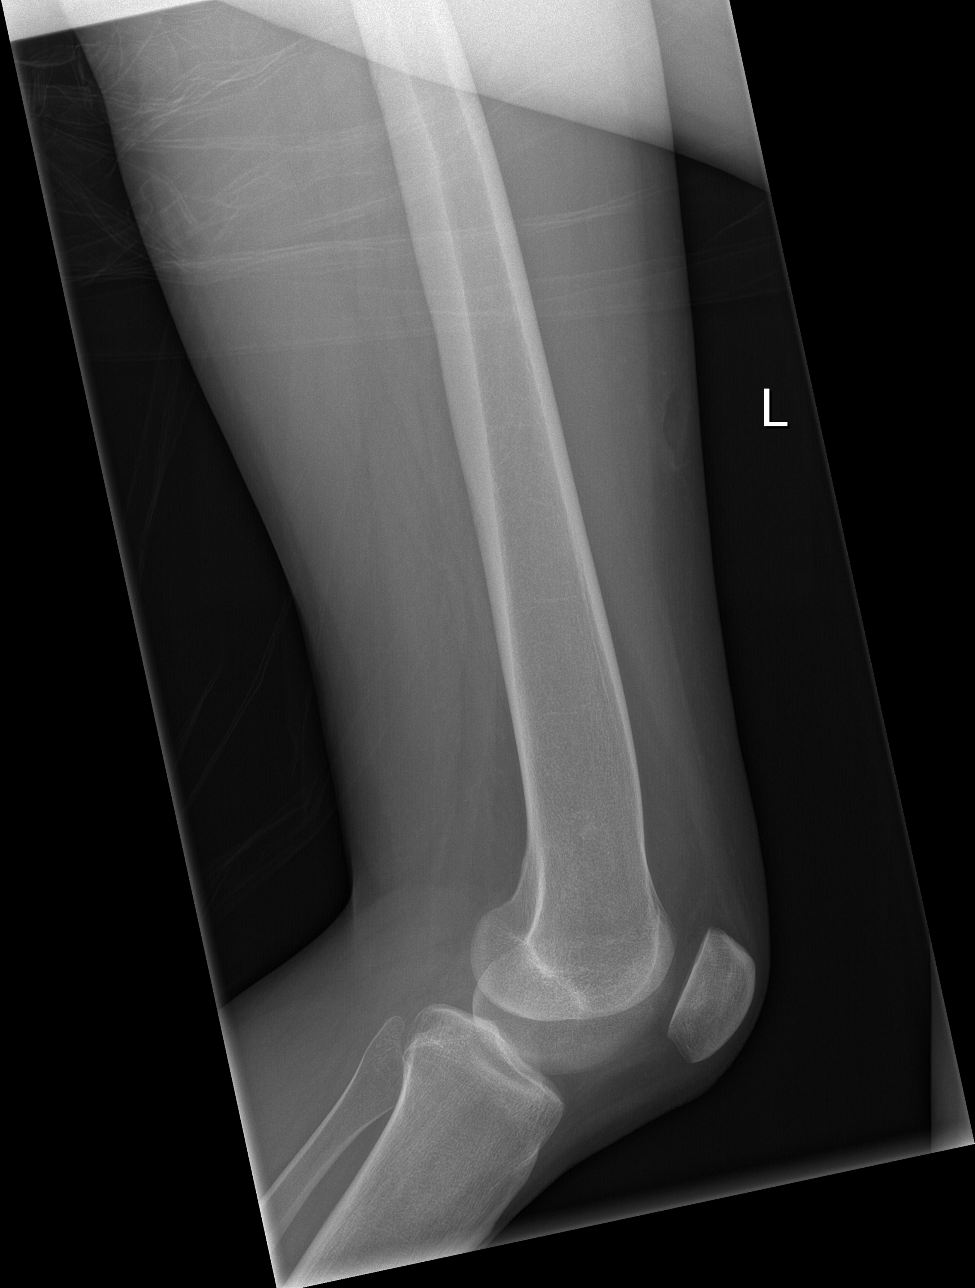

[4 of 4 positions shown; findings below may reference images not displayed]

FINDINGS: There is no evidence of fracture or other focal bone lesions. Soft
tissues are unremarkable.
IMPRESSION: Negative.

## 2020-07-12 ENCOUNTER — Emergency Department (HOSPITAL_COMMUNITY)
Admission: EM | Admit: 2020-07-12 | Discharge: 2020-07-12 | Disposition: A | Discharge status: Home / Self Care | Payer: Medicaid Other | Attending: Emergency Medicine | Admitting: Emergency Medicine

## 2020-07-12 ENCOUNTER — Other Ambulatory Visit: Payer: Self-pay

## 2020-07-12 ENCOUNTER — Encounter (HOSPITAL_COMMUNITY): Payer: Self-pay | Admitting: Emergency Medicine

## 2020-07-12 DIAGNOSIS — S09301A Unspecified injury of right middle and inner ear, initial encounter: Secondary | ICD-10-CM | POA: Diagnosis not present

## 2020-07-12 DIAGNOSIS — Y92009 Unspecified place in unspecified non-institutional (private) residence as the place of occurrence of the external cause: Secondary | ICD-10-CM | POA: Insufficient documentation

## 2020-07-12 DIAGNOSIS — X58XXXA Exposure to other specified factors, initial encounter: Secondary | ICD-10-CM | POA: Diagnosis not present

## 2020-07-12 DIAGNOSIS — S0991XA Unspecified injury of ear, initial encounter: Secondary | ICD-10-CM

## 2020-07-12 DIAGNOSIS — H9191 Unspecified hearing loss, right ear: Secondary | ICD-10-CM | POA: Diagnosis not present

## 2020-07-12 DIAGNOSIS — R000 Tachycardia, unspecified: Secondary | ICD-10-CM | POA: Insufficient documentation

## 2020-07-12 DIAGNOSIS — J45909 Unspecified asthma, uncomplicated: Secondary | ICD-10-CM | POA: Insufficient documentation

## 2020-07-12 NOTE — ED Triage Notes (Signed)
Per EMS, patient from home, reports cleaning out ear with bulge syringe x6 days ago and has not been able to hear from ear since that time.

## 2020-07-12 NOTE — Discharge Instructions (Addendum)
You came to the hospital to have your right ear evaluated.  Your physical exam showed findings that are consistent with a perforated tympanic membrane also known as an eardrum.  Please do not put anything in your ear.  Please do not submerge your head underwater.  Please follow-up with the ear nose and throat doctor I have provided information for.  Get help right away if you: Have sudden hearing loss. Become very dizzy. Get very bad pain in your ear. Have weakness in your face. Cannot move parts of your face.

## 2020-07-12 NOTE — ED Provider Notes (Signed)
[facility] DEPT Provider Note   CSN: [id number] Arrival date & time: 07/12/20  1721     History Chief Complaint  Patient presents with  . Ear Injury    Roger Henderson is a 30 y.o. male with history of asthma, eczema, learning disability.  Patient presents with a chief complaint of decreased hearing in his right ear since Monday 1/24, has been constant, no alleviating or aggravating factors, no pain, no otorrhea.  Patient states the symptoms started after cleaning his ear with a bulb suction.  He states he pushed the bulb into far and when removing saw blood on the tip of the bulb.    Patient denies any history of hearing loss previous right ear trauma.  Patient denies any fevers, chills, rhinorrhea, facial asymmetry, slurred speech.    The history is provided by the patient.       Past Medical History:  Diagnosis Date  . Allergic rhinitis, cause unspecified 09/30/2009  . ASTHMA 03/27/2009  . ECZEMA, ATOPIC 06/26/2009  . LEARNING DISABILITY 05/11/2010    Patient Active Problem List   Diagnosis Date Noted  . LEARNING DISABILITY 05/11/2010  . URTICARIA 05/11/2010  . ANGIOEDEMA 05/11/2010  . SINUSITIS- ACUTE-NOS 03/01/2010  . FATIGUE 10/20/2009  . Allergic rhinitis, cause unspecified 09/30/2009  . ECZEMA, ATOPIC 06/26/2009  . ASTHMA 03/27/2009    History reviewed. No pertinent surgical history.     Family History  Problem Relation Age of Onset  . Eczema Father   . Diabetes Father   . Thyroid disease Father   . Emphysema Other        Grandmother    Social History   Tobacco Use  . Smoking status: Never Smoker  Substance Use Topics  . Drug use: No    Home Medications Prior to Admission medications   Medication Sig Start Date End Date Taking? Authorizing Provider  desoximetasone (TOPICORT) 0.25 % cream Apply topically 2 (two) times daily.      [provider]  fexofenadine (ALLEGRA) 180 MG tablet Take 180 mg by  mouth daily.    [provider]  hydrOXYzine (ATARAX) 25 MG tablet Take 25 mg by mouth every 6 (six) hours as needed. For itch     [provider]  hydrOXYzine (ATARAX/VISTARIL) 25 MG tablet Take 1 tablet (25 mg total) by mouth every 6 (six) hours as needed for itching. 09/01/13   Luana Shu, Freeman Caldron, PA-C  ketoconazole (NIZORAL) 2 % cream Apply 1 application topically daily. 09/01/13   Liam Graham, PA-C  loratadine (CLARITIN) 10 MG tablet take 1 tablet by mouth once daily 10/26/11   Janith Lima, MD  methylPREDNISolone (MEDROL DOSEPAK) 4 MG tablet follow package directions 06/29/13   Janne Napoleon, NP  PredniSONE 10 MG KIT 12 day taper dose pack. Use as directed 09/01/13   Allena Katz H, PA-C  triamcinolone cream (KENALOG) 0.1 % Apply 1 application topically 2 (two) times daily. May use on face 06/29/13   Janne Napoleon, NP  triamcinolone cream (KENALOG) 0.1 % Apply 1 application topically 2 (two) times daily. 09/01/13   Liam Graham, PA-C    Allergies    Patient has no known allergies.  Review of Systems   Review of Systems  Constitutional: Negative for chills and fever.  HENT: Positive for hearing loss. Negative for ear discharge and rhinorrhea.   Eyes: Negative for visual disturbance.  Neurological: Negative for facial asymmetry.    Physical Exam Updated Vital Signs BP  106/87 (BP Location: Right Arm)   Pulse (!) 107   Temp 98.1 F (36.7 C) (Oral)   Resp 18   Ht $R'5\' 11"'Wi$  (1.803 m)   Wt 57 kg   SpO2 97%   BMI 17.53 kg/m   Physical Exam Vitals and nursing note reviewed.  Constitutional:      General: He is not in acute distress.    Appearance: He is not ill-appearing, toxic-appearing or diaphoretic.  HENT:     Head: Normocephalic.     Right Ear: Decreased hearing noted. Swelling present. No laceration, drainage or tenderness. No middle ear effusion. No foreign body. No mastoid tenderness. There is hemotympanum.     Left Ear: Hearing normal. No decreased  hearing noted. No drainage, swelling or tenderness.  No middle ear effusion. There is no impacted cerumen. No foreign body. No mastoid tenderness. No hemotympanum. Tympanic membrane is not injected, scarred, perforated, erythematous, retracted or bulging.     Ears:     Weber exam findings: lateralizes right.    Right Rinne: BC > AC.    Comments: Right ear: Blood and cerumen noted to auditory canal, hemotympanum,  Eyes:     General: No scleral icterus.       Right eye: No discharge.        Left eye: No discharge.  Cardiovascular:     Rate and Rhythm: Tachycardia present.  Pulmonary:     Effort: Pulmonary effort is normal.  Skin:    General: Skin is warm and dry.  Neurological:     General: No focal deficit present.     Mental Status: He is alert.     GCS: GCS eye subscore is 4. GCS verbal subscore is 5. GCS motor subscore is 6.     Cranial Nerves: No facial asymmetry.  Psychiatric:        Behavior: Behavior is cooperative.     ED Results / Procedures / Treatments   Labs (all labs ordered are listed, but only abnormal results are displayed) Labs Reviewed - No data to display  EKG None  Radiology No results found.  Procedures Procedures   Medications Ordered in ED Medications - No data to display  ED Course  I have reviewed the triage vital signs and the nursing notes.  Pertinent labs & imaging results that were available during my care of the patient were reviewed by me and considered in my medical decision making (see chart for details).    MDM Rules/Calculators/A&P                          Alert 30 year old male in no acute distress, nontoxic appearing.  Patient presents with chief complaint of right ear injury and decreased hearing.  Blood and cerumen noted to auditory canal, hemotympanum.  Weber test lateralizes to right, and Rinne test BC>AC.  No facial asymmetry noted.  Although unable to visualize any tympanic membrane perforation suspect perforation.  Have  patient follow-up with ENT.       Final Clinical Impression(s) / ED Diagnoses Final diagnoses:  Injury of right ear, initial encounter    Rx / DC Orders ED Discharge Orders    None       Dyann Ruddle 07/12/20 1806    Isla Pence, MD 07/12/20 1914
